# Patient Record
Sex: Female | Born: 1960 | Race: Black or African American | Hispanic: No | Smoking: Never smoker
Health system: Southern US, Community
[De-identification: ages and names within clinical notes are randomized; demographics above are authoritative.]

## PROBLEM LIST (undated history)

## (undated) DIAGNOSIS — Z9889 Other specified postprocedural states: Secondary | ICD-10-CM

## (undated) DIAGNOSIS — H8109 Meniere's disease, unspecified ear: Secondary | ICD-10-CM

## (undated) DIAGNOSIS — J45909 Unspecified asthma, uncomplicated: Secondary | ICD-10-CM

## (undated) DIAGNOSIS — G4730 Sleep apnea, unspecified: Secondary | ICD-10-CM

## (undated) DIAGNOSIS — E119 Type 2 diabetes mellitus without complications: Secondary | ICD-10-CM

## (undated) DIAGNOSIS — E785 Hyperlipidemia, unspecified: Secondary | ICD-10-CM

## (undated) DIAGNOSIS — I10 Essential (primary) hypertension: Secondary | ICD-10-CM

## (undated) DIAGNOSIS — R112 Nausea with vomiting, unspecified: Secondary | ICD-10-CM

## (undated) HISTORY — DX: Unspecified asthma, uncomplicated: J45.909

## (undated) HISTORY — DX: Sleep apnea, unspecified: G47.30

## (undated) HISTORY — DX: Other specified postprocedural states: R11.2

## (undated) HISTORY — DX: Meniere's disease, unspecified ear: H81.09

## (undated) HISTORY — DX: Type 2 diabetes mellitus without complications: E11.9

## (undated) HISTORY — DX: Hyperlipidemia, unspecified: E78.5

## (undated) HISTORY — DX: Other specified postprocedural states: Z98.890

---

## 1998-06-20 HISTORY — PX: TUBAL LIGATION: SHX77

## 2004-06-29 ENCOUNTER — Emergency Department (HOSPITAL_COMMUNITY): Admission: EM | Admit: 2004-06-29 | Discharge: 2004-06-29 | Payer: Self-pay | Admitting: Family Medicine

## 2005-12-01 ENCOUNTER — Ambulatory Visit (HOSPITAL_COMMUNITY): Admission: RE | Admit: 2005-12-01 | Discharge: 2005-12-01 | Payer: Self-pay | Admitting: Obstetrics

## 2005-12-06 ENCOUNTER — Emergency Department (HOSPITAL_COMMUNITY): Admission: EM | Admit: 2005-12-06 | Discharge: 2005-12-06 | Payer: Self-pay | Admitting: Family Medicine

## 2007-02-28 ENCOUNTER — Emergency Department (HOSPITAL_COMMUNITY): Admission: EM | Admit: 2007-02-28 | Discharge: 2007-02-28 | Payer: Self-pay | Admitting: Emergency Medicine

## 2007-06-21 HISTORY — PX: NOVASURE ABLATION: SHX5394

## 2007-10-16 ENCOUNTER — Emergency Department (HOSPITAL_COMMUNITY): Admission: EM | Admit: 2007-10-16 | Discharge: 2007-10-16 | Payer: Self-pay | Admitting: Family Medicine

## 2008-12-12 ENCOUNTER — Ambulatory Visit (HOSPITAL_COMMUNITY): Admission: RE | Admit: 2008-12-12 | Discharge: 2008-12-12 | Payer: Self-pay | Admitting: Obstetrics

## 2008-12-18 ENCOUNTER — Encounter: Admission: RE | Admit: 2008-12-18 | Discharge: 2008-12-18 | Payer: Self-pay | Admitting: Cardiology

## 2009-01-07 ENCOUNTER — Ambulatory Visit (HOSPITAL_COMMUNITY): Admission: RE | Admit: 2009-01-07 | Discharge: 2009-01-07 | Payer: Self-pay | Admitting: Obstetrics

## 2009-01-07 ENCOUNTER — Encounter (INDEPENDENT_AMBULATORY_CARE_PROVIDER_SITE_OTHER): Payer: Self-pay | Admitting: Obstetrics

## 2009-04-09 ENCOUNTER — Other Ambulatory Visit (HOSPITAL_COMMUNITY): Admission: RE | Admit: 2009-04-09 | Discharge: 2009-05-05 | Payer: Self-pay | Admitting: Psychiatry

## 2009-04-09 ENCOUNTER — Ambulatory Visit: Payer: Self-pay | Admitting: Psychiatry

## 2010-09-26 LAB — BASIC METABOLIC PANEL WITH GFR
BUN: 6 mg/dL (ref 6–23)
CO2: 29 meq/L (ref 19–32)
Calcium: 9.3 mg/dL (ref 8.4–10.5)
Chloride: 100 meq/L (ref 96–112)
Creatinine, Ser: 0.63 mg/dL (ref 0.4–1.2)
GFR calc non Af Amer: >60 mL/min
Glucose, Bld: 88 mg/dL (ref 70–99)
Potassium: 3.1 meq/L — ABNORMAL LOW (ref 3.5–5.1)
Sodium: 139 meq/L (ref 135–145)

## 2010-09-26 LAB — PREGNANCY, URINE: Preg Test, Ur: NEGATIVE

## 2010-09-26 LAB — CBC: RDW: 15.3 % (ref 11.5–15.5)

## 2010-11-02 NOTE — Op Note (Signed)
Annette Cabrera, Annette Cabrera             ACCOUNT NO.:  [id number]   MEDICAL RECORD NO.:  [id number]          PATIENT TYPE:  AMB   LOCATION:  SDC                           FACILITY:  [facility]   PHYSICIAN:  Kathreen Cosier, M.D.DATE OF BIRTH:  [birth year]   DATE OF PROCEDURE:  01/07/2009  DATE OF DISCHARGE:  01/07/2009                               OPERATIVE REPORT   PREOPERATIVE DIAGNOSIS:  Dysfunctional uterine bleeding.   PROCEDURE:  Dilation and curettage, hysteroscopy, NovaSure ablation.   POSTOPERATIVE DIAGNOSIS:  Dysfunctional uterine bleeding.   SURGEON:  Kathreen Cosier, MD   Under general anesthesia, the patient was in a lithotomy position,  perineum, and vagina prepped and draped.  Bladder emptied with the  straight catheter.  Bimanual exam revealed uterus to be top normal size,  negative adnexa.  Speculum placed in the vagina.  Cervix injected with  10 mL of 1% Xylocaine.  Endocervix was curetted, small amount of tissue  obtained.  Endometrial cavity sounded 10 cm.  The cervical length was  measured at 5 cm.  Cervix was then dilated to #25 Shawnie Pons.  Hysteroscope  inserted.  The cavity was normal.  The hysteroscope was then removed.  Sharp curettage performed.  Small amount of tissue obtained.  NovaSure  device was then inserted.  Cavity integrity test passed and the cavity  width was 4.5 cm.  The endometrial cavity was then ablated for 1 minute  and 7 seconds at 124 watts.  After ablation, the NovaSure device  removed.  The hysteroscope reinserted and the cavity was totally  ablated.  The fluid deficit was 45 mL.  The patient tolerated the  procedure well, taken to recovery room in good condition.            ______________________________  Kathreen Cosier, M.D.     BAM/MEDQ  D:  01/07/2009  T:  01/08/2009  Job:  [id number]

## 2011-02-08 ENCOUNTER — Emergency Department (HOSPITAL_COMMUNITY)
Admission: EM | Admit: 2011-02-08 | Discharge: 2011-02-08 | Disposition: A | Discharge status: Home / Self Care | Payer: 59 | Attending: Emergency Medicine | Admitting: Emergency Medicine

## 2011-02-08 ENCOUNTER — Emergency Department (HOSPITAL_COMMUNITY): Payer: 59

## 2011-02-08 DIAGNOSIS — I10 Essential (primary) hypertension: Secondary | ICD-10-CM | POA: Insufficient documentation

## 2011-02-08 DIAGNOSIS — I517 Cardiomegaly: Secondary | ICD-10-CM | POA: Insufficient documentation

## 2011-02-08 DIAGNOSIS — M546 Pain in thoracic spine: Secondary | ICD-10-CM | POA: Insufficient documentation

## 2011-02-08 DIAGNOSIS — R072 Precordial pain: Secondary | ICD-10-CM | POA: Insufficient documentation

## 2011-02-08 DIAGNOSIS — R0602 Shortness of breath: Secondary | ICD-10-CM | POA: Insufficient documentation

## 2011-02-08 LAB — POCT I-STAT TROPONIN I: Troponin i, poc: 0.02 ng/mL (ref 0.00–0.08)

## 2011-02-08 LAB — COMPREHENSIVE METABOLIC PANEL
ALT: 15 U/L (ref 0–35)
CO2: 28 mEq/L (ref 19–32)
Chloride: 101 mEq/L (ref 96–112)
Creatinine, Ser: 0.57 mg/dL (ref 0.50–1.10)
GFR calc Af Amer: >60 mL/min (ref >60)
Glucose, Bld: 105 mg/dL — ABNORMAL HIGH (ref 70–99)
Potassium: 3 mEq/L — ABNORMAL LOW (ref 3.5–5.1)
Sodium: 140 mEq/L (ref 135–145)

## 2011-02-08 LAB — CBC
HCT: 41.2 % (ref 36.0–46.0)
MCH: 30.6 pg (ref 26.0–34.0)
MCHC: 35.7 g/dL (ref 30.0–36.0)
Platelets: 317 10*3/uL (ref 150–400)
RBC: 4.8 MIL/uL (ref 3.87–5.11)

## 2011-04-01 LAB — POCT RAPID STREP A: Streptococcus, Group A Screen (Direct): POSITIVE — AB

## 2012-06-05 ENCOUNTER — Emergency Department (HOSPITAL_COMMUNITY): Payer: Self-pay

## 2012-06-05 ENCOUNTER — Encounter (HOSPITAL_COMMUNITY): Payer: Self-pay | Admitting: Emergency Medicine

## 2012-06-05 ENCOUNTER — Emergency Department (HOSPITAL_COMMUNITY)
Admission: EM | Admit: 2012-06-05 | Discharge: 2012-06-05 | Disposition: A | Discharge status: Home / Self Care | Payer: Self-pay | Attending: Emergency Medicine | Admitting: Emergency Medicine

## 2012-06-05 DIAGNOSIS — S4980XA Other specified injuries of shoulder and upper arm, unspecified arm, initial encounter: Secondary | ICD-10-CM | POA: Insufficient documentation

## 2012-06-05 DIAGNOSIS — Y929 Unspecified place or not applicable: Secondary | ICD-10-CM | POA: Insufficient documentation

## 2012-06-05 DIAGNOSIS — S46909A Unspecified injury of unspecified muscle, fascia and tendon at shoulder and upper arm level, unspecified arm, initial encounter: Secondary | ICD-10-CM | POA: Insufficient documentation

## 2012-06-05 DIAGNOSIS — Y939 Activity, unspecified: Secondary | ICD-10-CM | POA: Insufficient documentation

## 2012-06-05 DIAGNOSIS — M25519 Pain in unspecified shoulder: Secondary | ICD-10-CM

## 2012-06-05 DIAGNOSIS — I10 Essential (primary) hypertension: Secondary | ICD-10-CM | POA: Insufficient documentation

## 2012-06-05 DIAGNOSIS — W010XXA Fall on same level from slipping, tripping and stumbling without subsequent striking against object, initial encounter: Secondary | ICD-10-CM | POA: Insufficient documentation

## 2012-06-05 HISTORY — DX: Essential (primary) hypertension: I10

## 2012-06-05 MED ORDER — OXYCODONE-ACETAMINOPHEN 5-325 MG PO TABS: 1.0000 | ORAL_TABLET | Freq: Four times a day (QID) | ORAL | Status: DC | PRN

## 2012-06-05 NOTE — ED Provider Notes (Signed)
Medical screening examination/treatment/procedure(s) were performed by non-physician practitioner and as supervising physician I was immediately available for consultation/collaboration.  Gerhard Munch, MD 06/05/12 2356

## 2012-06-05 NOTE — ED Provider Notes (Signed)
History     CSN: [id number]  Arrival date & time 06/05/12  1358   First MD Initiated Contact with Patient 06/05/12 1554      Chief Complaint  Patient presents with  . Arm Pain    (Consider location/radiation/quality/duration/timing/severity/associated sxs/prior treatment) HPI Comments: 51 year old female presents to the emergency department complaining of left shoulder pain after falling on her arm 3 months ago. She was outside when her slipper got stuck and she tripped and fell onto her left shoulder. Her shoulder has been bothering on and off since. Describes the pain as throbbing, rated 8/10 worse with movement. Occasionally has difficulty getting comfortable sleeping due to pain. Naproxen provides temporary relief. Denies numbness or tingling down her arm. No previous injury to this shoulder.  Patient is a 51 y.o. female presenting with arm pain. The history is provided by the patient.  Arm Pain Pertinent negatives include no numbness.    Past Medical History  Diagnosis Date  . Hypertension     History reviewed. No pertinent past surgical history.  History reviewed. No pertinent family history.  History  Substance Use Topics  . Smoking status: Never Smoker   . Smokeless tobacco: Not on file  . Alcohol Use: No    OB History    Grav Para Term Preterm Abortions TAB SAB Ect Mult Living                  Review of Systems  Constitutional: Negative for activity change.  Musculoskeletal:       Positive for left shoulder pain.  Skin: Negative for wound.  Neurological: Negative for numbness.    Allergies  Penicillins  Home Medications   Current Outpatient Rx  Name  Route  Sig  Dispense  Refill  . NAPROXEN SODIUM 220 MG PO TABS   Oral   Take 220 mg by mouth 2 (two) times daily as needed. For pain.         . OXYCODONE-ACETAMINOPHEN 5-325 MG PO TABS   Oral   Take 1-2 tablets by mouth every 6 (six) hours as needed for pain.   10 tablet   0     BP  174/100  Pulse 91  Temp 98.4 F (36.9 C) (Oral)  Resp 16  SpO2 98%  Physical Exam  Constitutional: She is oriented to person, place, and time. She appears well-developed. No distress.       Obese  HENT:  Head: Normocephalic and atraumatic.  Mouth/Throat: Oropharynx is clear and moist.  Eyes: Conjunctivae normal and EOM are normal. Pupils are equal, round, and reactive to light.  Neck: Normal range of motion. Neck supple. Muscular tenderness (left trapezius) present. No spinous process tenderness present.  Cardiovascular: Normal rate, regular rhythm, normal heart sounds and intact distal pulses.   Pulmonary/Chest: Effort normal and breath sounds normal.  Musculoskeletal:       Left shoulder: She exhibits decreased range of motion (with abduction to 90 degrees with passive ROM. decreased active ROM in all directions), tenderness (entire shoulder girdle), bony tenderness and decreased strength. She exhibits no swelling, no deformity and normal pulse.       Positive impingement sign.  Neurological: She is alert and oriented to person, place, and time. No sensory deficit.  Skin: Skin is warm and dry.  Psychiatric: She has a normal mood and affect. Her behavior is normal.    ED Course  Procedures (including critical care time)  Labs Reviewed - No data to display Dg Shoulder Left  06/05/2012  *RADIOLOGY REPORT*  Clinical Data: Shoulder pain post fall few days ago  LEFT SHOULDER - 2+ VIEW  Comparison: None.  Findings: Four views of the left shoulder submitted.  No acute fracture or subluxation.  Mild degenerative changes left acromioclavicular joint.  Mild inferior spurring of distal clavicle.  IMPRESSION: No acute fracture or subluxation.  Mild degenerative changes left AC joint.   Original Report Authenticated By: Natasha Mead, M.D.      1. Shoulder pain       MDM  51 year old female with left shoulder pain after falling 3 months ago. I will provider with a sling along with pain  medication for her nighttime when she cannot get comfortable. I advised her to followup with one of the resources to develop care with a primary care physician to discuss a possible MRI of her shoulder. There is concern for rotator cuff injury. Advised her to rest and avoid heavy lifting. Also advised ice.        Trevor Mace, PA-C 06/05/12 1627

## 2012-06-05 NOTE — ED Notes (Signed)
Pt states that she began having lt arm/shoulder pain when she fell on her arm about a month ago.  Has been hurting on and off ever since.  Pain with movement 8/10.

## 2012-06-05 NOTE — ED Notes (Signed)
Benedict, Ortho Tech, called regarding sling immobilizer, will monitor.

## 2013-04-24 ENCOUNTER — Ambulatory Visit (INDEPENDENT_AMBULATORY_CARE_PROVIDER_SITE_OTHER): Payer: Managed Care, Other (non HMO) | Admitting: Family Medicine

## 2013-04-24 ENCOUNTER — Encounter: Payer: Self-pay | Admitting: Family Medicine

## 2013-04-24 VITALS — BP 173/116 | HR 99 | Temp 98.2°F | Wt 258.0 lb

## 2013-04-24 DIAGNOSIS — Z7689 Persons encountering health services in other specified circumstances: Secondary | ICD-10-CM

## 2013-04-24 DIAGNOSIS — Z7189 Other specified counseling: Secondary | ICD-10-CM | POA: Diagnosis not present

## 2013-04-24 DIAGNOSIS — Z23 Encounter for immunization: Secondary | ICD-10-CM

## 2013-04-24 DIAGNOSIS — I10 Essential (primary) hypertension: Secondary | ICD-10-CM | POA: Diagnosis not present

## 2013-04-24 LAB — BASIC METABOLIC PANEL
Calcium: 9.7 mg/dL (ref 8.4–10.5)
Glucose, Bld: 85 mg/dL (ref 70–99)
Potassium: 3.6 mEq/L (ref 3.5–5.3)

## 2013-04-24 MED ORDER — AMLODIPINE BESYLATE 5 MG PO TABS: 5.0000 mg | ORAL_TABLET | Freq: Every day | ORAL | Status: DC

## 2013-04-24 MED ORDER — HYDROCHLOROTHIAZIDE 25 MG PO TABS: 25.0000 mg | ORAL_TABLET | Freq: Every day | ORAL | Status: DC

## 2013-04-24 NOTE — Patient Instructions (Signed)
It was nice to meet you today!  I sent in two blood pressure medicines for you: -amlodipine 5mg  daily -HCTZ 25mg  daily  I will call you if your test results are not normal.  Otherwise, I will send you a letter.  If you do not hear from me with in 2 weeks please call our office.      Return for a visit in 2-3 weeks to recheck your blood pressure, at that point we will repeat your bloodwork to be sure your kidneys are okay with the new medicine.  Be well, Dr. Pollie Meyer

## 2013-04-24 NOTE — Assessment & Plan Note (Addendum)
Blood pressure elevated, even on recheck. Given that she is African American and her blood pressure is as high as it is, we'll go ahead and start 2 medications today. Will start with amlodipine 5 mg daily and HCTZ 25 mg daily. Will check BMET today to establish baseline renal function, and have patient return in 2-3 weeks for a visit to recheck her blood pressure. At that time we'll repeat her BMET. Patient is agreeable to this plan.

## 2013-04-24 NOTE — Progress Notes (Signed)
Patient ID: Annette Cabrera, female   DOB: 1961-06-10, 52 y.o.   MRN: [id number]   HPI:  Patient presents today for a new patient appointment to establish general primary care, also to discuss hypertension.  Hypertension: Patient reports that she is to take 5 different blood pressure medicines. She most recently was on Exforge, but had to stop taking it due to cost. Was previously getting care for her hypertension at an urgent care office. She has not taken this in about 2 months. She denies any history of kidney problems or heart problems. She had a stress test at [facility] about 2 years ago, which was normal per patient. She does not check her blood pressure at home. She did have it checked yesterday and says it was 140/90. She denies any chest pain or shortness of breath. Occasionally has headaches with sinus congestion, denies any vision changes. She does endorse occasional swelling of her legs. She had blood work earlier this year at an urgent care facility. She does not have a copy of those results with her today.  ROS: See HPI. Complete ROS sheet also positive for trouble seeing (wears glasses), swelling of feet, and muscle cramps or aches.  Past Medical Hx:  Hypertension Mnire's disease  Past Surgical Hx: NovaSure ablation, tubal ligation  OB/GYN history: [id number], had one elective abortion, all other pregnancies were term.  Family Hx: updated in Epic. Strong family history of diabetes. Father died of lung cancer. Sister was just diagnosed with some kind of pelvic bone cancer. No family history of colon or breast cancer.  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 She states she walks a lot but "not enough". She's never smoked cigarettes. She does not drink alcohol or use illicit drugs. She feels safe in her relationships, but states she has not any romantic relationship. She denies any symptoms of depression.  Medications: Does not currently take any medications regularly  Allergies: Has a  history of penicillin allergy, states she passed out when she took it  PHYSICAL EXAM: BP 173/116  Pulse 99  Temp(Src) 98.2 F (36.8 C) (Oral)  Wt 258 lb (117.028 kg) Gen: No acute distress, pleasant and cooperative Heart: Regular rate and rhythm, and no murmurs appreciated Lungs: Clear to auscultation bilaterally, normal respiratory effort Abdomen: Soft, nontender to palpation, no appreciable masses or organomegaly Neuro: Grossly nonfocal, speech intact, pupils equal round reactive to light Extremities: No appreciable lower extremity edema bilaterally  ASSESSMENT/PLAN:  # Health maintenance:  -Behind on multiple health maintenance items including Pap, mammogram, and colonoscopy. -Advised patient that we will want to schedule her for physical to get her updated on these multiple health maintenance items. -flu shot given today  # See also problem based charting.  FOLLOW UP: F/u in 2-3 weeks for repeat blood pressure and BMET.

## 2013-04-26 ENCOUNTER — Encounter: Payer: Self-pay | Admitting: Family Medicine

## 2013-05-08 ENCOUNTER — Ambulatory Visit (INDEPENDENT_AMBULATORY_CARE_PROVIDER_SITE_OTHER): Payer: Medicare HMO | Admitting: *Deleted

## 2013-05-08 VITALS — BP 146/94

## 2013-05-08 DIAGNOSIS — I10 Essential (primary) hypertension: Secondary | ICD-10-CM

## 2013-05-08 LAB — BASIC METABOLIC PANEL
BUN: 12 mg/dL (ref 6–23)
CO2: 29 mEq/L (ref 19–32)
Calcium: 9.6 mg/dL (ref 8.4–10.5)
Creat: 0.72 mg/dL (ref 0.50–1.10)
Glucose, Bld: 121 mg/dL — ABNORMAL HIGH (ref 70–99)
Sodium: 141 mEq/L (ref 135–145)

## 2013-05-08 NOTE — Progress Notes (Signed)
Patient in today for blood pressure check. Blood pressure taken manually in left arm with large cuff was 146/94. Patient reports regular taking of blood pressure medications since last office visit. Last note stated that MD wanted repeat BMET and for patient to follow up in two weeks, put in order for BMET and scheduled next available appointment with PCP for patient to follow up. Will forward note to PCP.

## 2013-05-11 ENCOUNTER — Telehealth: Payer: Self-pay | Admitting: Family Medicine

## 2013-05-11 NOTE — Telephone Encounter (Signed)
Late Entry note:  Attempted to contact patient last night on 11/21 at around 8pm, to discuss hypokalemia on lab results. The only phone number we have on file for pt is her work phone, and she did not answer this phone. I am planning to send in an rx for potassium chloride PO qd but want to talk to her about this first. Will attempt to contact again on Monday. Will also plan to increase her amlodipine to 10mg  daily since she was still hypertensive at RN visit. She will need an office visit in 3-4 weeks to reassess BP control.  Latrelle Dodrill, MD

## 2013-05-14 MED ORDER — POTASSIUM CHLORIDE ER 20 MEQ PO TBCR: 40.0000 meq | EXTENDED_RELEASE_TABLET | Freq: Every day | ORAL | Status: DC

## 2013-05-14 MED ORDER — AMLODIPINE BESYLATE 10 MG PO TABS: 10.0000 mg | ORAL_TABLET | Freq: Every day | ORAL | Status: DC

## 2013-05-14 NOTE — Telephone Encounter (Signed)
Attempted to reach patient again but no answer. Left voicemail asking patient to return the call. When patient returns the call, please inform her of the following:  - Her potassium is low (likely from the hydrochlorothiazide) so I am sending in a prescription for potassium pills for her to take each day. This is important to keep the potassium from being too low. She should still continue to taking the HCTZ. - I sent in an increased dose of the amlodipine since her BP was still elevated at her nurse visit last week. - She should follow up with me in 3-4 weeks in the office.   I am happy to return the call again if she has any questions. Latrelle Dodrill, MD

## 2013-05-20 NOTE — Telephone Encounter (Signed)
Red team, can you attempt to call this patient again today? Thanks! Annette Dodrill, MD

## 2013-05-20 NOTE — Telephone Encounter (Signed)
Called pt. LMVM to call back. Please see Dr.McIntyre's message. .Shanese Riemenschneider  

## 2013-05-21 NOTE — Telephone Encounter (Signed)
Spoke with patient and informed he of below 

## 2013-05-29 ENCOUNTER — Ambulatory Visit (INDEPENDENT_AMBULATORY_CARE_PROVIDER_SITE_OTHER): Payer: Medicare HMO | Admitting: Family Medicine

## 2013-05-29 ENCOUNTER — Encounter: Payer: Self-pay | Admitting: Family Medicine

## 2013-05-29 ENCOUNTER — Ambulatory Visit: Payer: Medicare HMO | Admitting: Family Medicine

## 2013-05-29 VITALS — BP 163/88 | HR 106 | Temp 97.6°F | Ht 62.0 in | Wt 254.0 lb

## 2013-05-29 DIAGNOSIS — J309 Allergic rhinitis, unspecified: Secondary | ICD-10-CM

## 2013-05-29 DIAGNOSIS — I10 Essential (primary) hypertension: Secondary | ICD-10-CM

## 2013-05-29 LAB — BASIC METABOLIC PANEL
BUN: 10 mg/dL (ref 6–23)
CO2: 30 mEq/L (ref 19–32)
Chloride: 99 mEq/L (ref 96–112)

## 2013-05-29 MED ORDER — LISINOPRIL 20 MG PO TABS: 20.0000 mg | ORAL_TABLET | Freq: Every day | ORAL | Status: DC

## 2013-05-29 NOTE — Progress Notes (Signed)
Patient ID: Annette Cabrera, female   DOB: 1960-11-23, 52 y.o.   MRN: [id number]  HPI:  Allergies: Pt states she takes allegra and sometimes nasocort. She used to use off brand medicine but stopped responding to it. She is currently only using allegra s needed. She started to notice worsening sinus problems during thanksgiving after smelling strong paint smell at her office. She denies any fevers. She blows clear mucous out of her nose.   HTN: Currently taking amlodipine 10mg  daily and HCTZ 25mg  daily. She does not check her blood pressure at home. States she feels well. The swelling she noticed in her legs previously has improved. Denies any chest pain, SOB, vision changes. Has only had a sinus headache as described above.   ROS: See HPI  PMFSH: has had previous tubal ligation and novasure ablation  PHYSICAL EXAM: BP 163/88  Pulse 106  Temp(Src) 97.6 F (36.4 C) (Oral)  Ht 5\' 2"  (1.575 m)  Wt 254 lb (115.214 kg)  BMI 46.45 kg/m2 Gen: NAD, pleasant and cooperative HEENT: NCAT, PERRL, TM's clear bilat, MMM, no oral exudates, mild facial tenderness throughout but no focal areas of tenderness or swellign Heart: RRR Lungs: CTAB, NWOB Neuro: grossly nonfocal, speech intact Ext: No appreciable lower extremity edema bilaterally   ASSESSMENT/PLAN:  See problem based charting for assessment/plan.   FOLLOW UP: F/u in 1 week for RN BP check and repeat BMET after starting lisinopril. I will contact pt for how soon she needs to f/u after that, depending on what her BP and BMET show.  [place] J. Pollie Meyer, MD [facility]

## 2013-05-29 NOTE — Patient Instructions (Signed)
It was great to see you again today!  For your sinuses - try switching to zyrtec 10mg  daily. Also increase the use of the neti pot. If you aren't getting any results, or have fevers, worsening pain, etc, please return for another visit.  For BP - I sent in a new medicine, lisinopril. We will need to recheck your kidney function in 1 week. Please schedule a lab appointment and nurse visit to recheck the BP in one week. We are also checking bloodwork today. I will call you if your test results are not normal.  Otherwise, I will send you a letter.  If you do not hear from me with in 2 weeks please call our office.      Be well, Dr. Pollie Meyer

## 2013-05-30 DIAGNOSIS — J309 Allergic rhinitis, unspecified: Secondary | ICD-10-CM | POA: Insufficient documentation

## 2013-05-30 NOTE — Assessment & Plan Note (Signed)
Recommend neti pot and switch to zyrtec, monitor for response. May need to add scheduled daily flonase and monitor for response.

## 2013-05-30 NOTE — Assessment & Plan Note (Signed)
BP remains elevated. Will check BMET today to ensure stability of K and Cr after starting potassium supplementation. Add lisinopril 20mg  daily. F/u in 1 week for repeat BMET and RN BP check.

## 2013-06-05 ENCOUNTER — Ambulatory Visit (INDEPENDENT_AMBULATORY_CARE_PROVIDER_SITE_OTHER): Payer: Medicare HMO | Admitting: *Deleted

## 2013-06-05 ENCOUNTER — Other Ambulatory Visit: Payer: Medicare HMO

## 2013-06-05 ENCOUNTER — Encounter: Payer: Self-pay | Admitting: Family Medicine

## 2013-06-05 ENCOUNTER — Ambulatory Visit: Payer: Medicare HMO | Admitting: Family Medicine

## 2013-06-05 VITALS — BP 132/85 | HR 91

## 2013-06-05 DIAGNOSIS — I10 Essential (primary) hypertension: Secondary | ICD-10-CM

## 2013-06-05 LAB — BASIC METABOLIC PANEL
CO2: 26 mEq/L (ref 19–32)
Calcium: 9.9 mg/dL (ref 8.4–10.5)
Creat: 0.76 mg/dL (ref 0.50–1.10)
Sodium: 139 mEq/L (ref 135–145)

## 2013-06-05 NOTE — Progress Notes (Signed)
BMP DONE TODAY Annette Cabrera 

## 2013-06-05 NOTE — Progress Notes (Signed)
Patient in today for blood pressure check. Blood pressure taken in left arm with large cuff was 132/85, pulse 91, patient reports regular taking of blood pressure medications. Will forward note to PCP.

## 2013-06-07 ENCOUNTER — Encounter: Payer: Self-pay | Admitting: Family Medicine

## 2013-06-07 NOTE — Progress Notes (Signed)
Patient ID: Annette Cabrera, female   DOB: 1961-02-26, 52 y.o.   MRN: [id number]  BP now at goal. BMET stable from lab appointment that same day. Will send pt letter asking her to f/u in 3 months with me in the office.  Latrelle Dodrill, MD

## 2013-06-10 ENCOUNTER — Telehealth: Payer: Self-pay | Admitting: Family Medicine

## 2013-06-10 NOTE — Telephone Encounter (Signed)
Called pt and informed. .Annette Cabrera  

## 2013-06-10 NOTE — Telephone Encounter (Signed)
Yes, patient should schedule office visit to address this problem. In the meantime she can try some ibuprofen over the counter to see if this helps. Please inform pt.  Latrelle Dodrill, MD

## 2013-06-10 NOTE — Telephone Encounter (Signed)
Will fwd. To PCP. Pt may need OV? Lorenda Hatchet, Renato Battles

## 2013-06-10 NOTE — Telephone Encounter (Signed)
Pt called to get advise on what to do about her back pain. When laying down it hurts, when she stands it goes away. She is not getting any sleep. jw

## 2013-06-11 ENCOUNTER — Ambulatory Visit (INDEPENDENT_AMBULATORY_CARE_PROVIDER_SITE_OTHER): Payer: Medicare HMO | Admitting: Family Medicine

## 2013-06-11 VITALS — BP 144/96 | HR 89 | Temp 98.2°F | Resp 16 | Wt 259.0 lb

## 2013-06-11 DIAGNOSIS — M549 Dorsalgia, unspecified: Secondary | ICD-10-CM

## 2013-06-11 LAB — POCT UA - MICROSCOPIC ONLY

## 2013-06-11 LAB — POCT URINALYSIS DIPSTICK
Bilirubin, UA: NEGATIVE
Blood, UA: NEGATIVE
Glucose, UA: NEGATIVE
Leukocytes, UA: NEGATIVE
Nitrite, UA: NEGATIVE
Spec Grav, UA: 1.015
Urobilinogen, UA: 0.2

## 2013-06-11 MED ORDER — CYCLOBENZAPRINE HCL 5 MG PO TABS: 2.5000 mg | ORAL_TABLET | Freq: Three times a day (TID) | ORAL | Status: DC | PRN

## 2013-06-11 MED ORDER — NAPROXEN 500 MG PO TABS: 500.0000 mg | ORAL_TABLET | Freq: Two times a day (BID) | ORAL | Status: DC

## 2013-06-11 NOTE — Patient Instructions (Signed)
It was great to meet you!  I think you may have had a virus for the last couple of days and some back spasms.   Take the naprosyn twice daily for the next 5 days, then as needed Try ice 20 minutes every 2 hours as needed.

## 2013-06-11 NOTE — Progress Notes (Signed)
Patient ID: Annette Cabrera, female   DOB: 05-27-1961, 52 y.o.   MRN: [id number]   Kevin Fenton, MD Phone: [phone]  Subjective:  Chief complaint-noted  # SDA for low back pain  52 year old female with hypertension here with 4 days of 9/10 intermittent dull low back pain is radiating down her right leg. She notes that it's been fine during the day when she is up walking around but worse at night when she lays down. She states that she's been losing sleep over. She also notes that improved over the last one day. She also notes fatigue, mild chills, constipation, and increased urinary frequency.  ROS-  positive chills No fever, sweats No dyspnea No chest pain No diarrhea, positive constipation No lower extremity weakness or numbness, bowel or bladder dysfunction, or saddle anesthesia.   Past Medical History Patient Active Problem List   Diagnosis Date Noted  . Back pain 06/11/2013  . Allergic rhinitis 05/30/2013  . Hypertension 04/24/2013    Medications- reviewed and updated Current Outpatient Prescriptions  Medication Sig Dispense Refill  . amLODipine (NORVASC) 10 MG tablet Take 1 tablet (10 mg total) by mouth daily.  30 tablet  1  . cyclobenzaprine (FLEXERIL) 5 MG tablet Take 0.5-1 tablets (2.5-5 mg total) by mouth 3 (three) times daily as needed for muscle spasms.  30 tablet  0  . hydrochlorothiazide (HYDRODIURIL) 25 MG tablet Take 1 tablet (25 mg total) by mouth daily.  30 tablet  1  . lisinopril (PRINIVIL,ZESTRIL) 20 MG tablet Take 1 tablet (20 mg total) by mouth daily.  30 tablet  1  . naproxen (NAPROSYN) 500 MG tablet Take 1 tablet (500 mg total) by mouth 2 (two) times daily with a meal. Take twice daily for 5 days, then as needed.  30 tablet  0  . potassium chloride 20 MEQ TBCR Take 40 mEq by mouth daily.  60 tablet  1   No current facility-administered medications for this visit.    Objective: BP 144/96  Pulse 89  Temp(Src) 98.2 F (36.8 C) (Oral)  Resp  16  Wt 259 lb (117.482 kg)  SpO2 97% Gen: NAD, alert, cooperative with exam HEENT: NCAT, EOMI, PERRL CV: RRR, good S1/S2, no murmur Resp: CTABL, no wheezes, non-labored Abd: SNTND, BS present, no guarding or organomegaly, no CVA tenderness Ext: Trace to 1+ pitting edema Neuro: Alert and oriented, strength 5/5 and sensation intact in all 4 extremities, normal gait. MSK: Mild tenderness of paraspinal muscles on R side in lumbar area, no bony tenderness,   Assessment/Plan:  Back pain On exam this appears to be musculoskeletal pain UA negative, micro with lots of squams,  No red flags, reviewed in detail Scheduled naprosyn for 5 days, Ice, and flexeril PRN Follow up with PCP as needed.     Orders Placed This Encounter  Procedures  . POCT urinalysis dipstick  . POCT UA - Microscopic Only    Meds ordered this encounter  Medications  . naproxen (NAPROSYN) 500 MG tablet    Sig: Take 1 tablet (500 mg total) by mouth 2 (two) times daily with a meal. Take twice daily for 5 days, then as needed.    Dispense:  30 tablet    Refill:  0  . cyclobenzaprine (FLEXERIL) 5 MG tablet    Sig: Take 0.5-1 tablets (2.5-5 mg total) by mouth 3 (three) times daily as needed for muscle spasms.    Dispense:  30 tablet    Refill:  0

## 2013-06-11 NOTE — Assessment & Plan Note (Signed)
On exam this appears to be musculoskeletal pain UA negative, micro with lots of squams,  No red flags, reviewed in detail Scheduled naprosyn for 5 days, Ice, and flexeril PRN Follow up with PCP as needed.

## 2013-06-28 ENCOUNTER — Telehealth: Payer: Self-pay | Admitting: *Deleted

## 2013-06-28 MED ORDER — HYDROCHLOROTHIAZIDE 25 MG PO TABS: 25.0000 mg | ORAL_TABLET | Freq: Every day | ORAL | Status: DC

## 2013-06-28 NOTE — Telephone Encounter (Signed)
Pt needs a refill on her HCTZ. Jazmin Hartsell,CMA

## 2013-06-28 NOTE — Telephone Encounter (Signed)
Rx sent in Brilynn Biasi J Jameica Couts, MD  

## 2013-07-09 ENCOUNTER — Other Ambulatory Visit: Payer: Self-pay | Admitting: Family Medicine

## 2013-07-09 MED ORDER — AMLODIPINE BESYLATE 10 MG PO TABS: 10.0000 mg | ORAL_TABLET | Freq: Every day | ORAL | Status: DC

## 2013-07-24 ENCOUNTER — Other Ambulatory Visit: Payer: Self-pay | Admitting: Family Medicine

## 2013-09-16 ENCOUNTER — Encounter: Payer: Self-pay | Admitting: Family Medicine

## 2013-09-16 ENCOUNTER — Ambulatory Visit (INDEPENDENT_AMBULATORY_CARE_PROVIDER_SITE_OTHER): Payer: Medicare HMO | Admitting: Family Medicine

## 2013-09-16 VITALS — BP 140/80 | HR 94 | Temp 98.9°F | Wt 257.0 lb

## 2013-09-16 DIAGNOSIS — H8109 Meniere's disease, unspecified ear: Secondary | ICD-10-CM

## 2013-09-16 DIAGNOSIS — I10 Essential (primary) hypertension: Secondary | ICD-10-CM

## 2013-09-16 NOTE — Progress Notes (Signed)
Patient ID: Annette Cabrera, female   DOB: 1961/02/03, 53 y.o.   MRN: [id number]  HPI:  HTN: Currently taking amlodipine 10mg  daily, HCTZ 25mg  daily, and lisinopril 20mg  daily. Feels well. Denies CP, SOB, lower extremity edema. Does not check her BP at home.  History of Mnire's disease: Patient reports that she was diagnosed with Mnire's disease 15 years ago by me and to Dr. in [place]. She has had intermittent flareups since that time. She does not take any medicine for this chronically. About 2 weeks ago she had a flare where she felt dizzy, had fullness in her ear, and felt confused. This is typical for her previous flares. It lasted about 3 days, and spontaneously improved. It had previously been about 2-3 months since she had a flare.  ROS: See HPI  [place]: HTN, hx menieres dz, obesity  PHYSICAL EXAM: BP 140/80  Pulse 94  Temp(Src) 98.9 F (37.2 C) (Oral)  Wt 257 lb (116.574 kg) Gen: NAD, pleasant, cooperative HEENT: NCAT, MMM, TM's clear bilaterally Heart: RRR, no murmurs Lungs: CTAB, NWOB Neuro: grossly nonfocal, speech intact, EOMI normal without nystagmus, PERRL, face symmetric, tongue protrudes midline, strength 5/5 in all extremities and w/ shoulder shrug  ASSESSMENT/PLAN:  # Health maintenance:  -reminded pt to schedule separate visit for health maintenance items  See problem based charting for additional assessment/plan.  FOLLOW UP: F/u in 2 months for HTN Referring to ENT for Menieres disease  [place] J. Ardelia Mems, [place]

## 2013-09-16 NOTE — Assessment & Plan Note (Signed)
Pt reports sx's of recent Menieres disease flare, now improved. As she is not established with ENT in town (previously saw one in [facility]), will refer pt to ENT for management of this condition. Precepted with Dr. Mingo Amber who agrees with this plan.

## 2013-09-16 NOTE — Assessment & Plan Note (Signed)
Well controlled. Since systolic BP is borderline (140) will have her return slightly sooner than normal (in 2 months) for recheck. Continue current medications.

## 2013-09-16 NOTE — Patient Instructions (Signed)
It was great to see you again today!  Follow up for your BP in 2 months. Continue current medicines.  I am referring you to ENT for your Menieres disease. You will get a phone call to schedule this appointment.   Schedule a separate health maintenance visit (routine yearly physical). At this visit we will make sure you are up to date on all your important health maintenance items.  Be well, Dr. Ardelia Mems

## 2013-09-25 ENCOUNTER — Telehealth: Payer: Self-pay | Admitting: Family Medicine

## 2013-09-25 NOTE — Telephone Encounter (Signed)
Called pt x 3 to let her know about ENT's appt but VM unable to take msg.  [facility]  Appt 10/03/2013 @2 :30pm  [address] [place] [address]  [phone]

## 2013-09-26 NOTE — Telephone Encounter (Signed)
Called pt again. Informed pt of appt. Pt repeated it back to me. Annette Cabrera

## 2013-11-15 ENCOUNTER — Ambulatory Visit: Payer: Medicare HMO | Admitting: Family Medicine

## 2014-01-02 ENCOUNTER — Encounter: Payer: Self-pay | Admitting: Family Medicine

## 2014-01-02 ENCOUNTER — Ambulatory Visit (INDEPENDENT_AMBULATORY_CARE_PROVIDER_SITE_OTHER): Payer: Medicare HMO | Admitting: Family Medicine

## 2014-01-02 VITALS — BP 141/93 | HR 98 | Temp 97.9°F | Wt 257.0 lb

## 2014-01-02 DIAGNOSIS — J0140 Acute pansinusitis, unspecified: Secondary | ICD-10-CM

## 2014-01-02 DIAGNOSIS — J018 Other acute sinusitis: Secondary | ICD-10-CM

## 2014-01-02 MED ORDER — AZITHROMYCIN 250 MG PO TABS: ORAL_TABLET | ORAL | Status: DC

## 2014-01-02 MED ORDER — GUAIFENESIN-CODEINE 100-10 MG/5ML PO SYRP: 5.0000 mL | ORAL_SOLUTION | Freq: Three times a day (TID) | ORAL | Status: DC | PRN

## 2014-01-02 NOTE — Patient Instructions (Signed)
Thank you for coming in, today!  I'm sorry you're not feeling well! I think you have a sinus infection that probably started out as a virus. It might have a bacterial component now that it's getting worse and staying around so long.  You should take an antibiotic called azithromycin, a "Z-pak." Take two 250 mg tablets today. Then take one 250 mg tablet per day for 4 more days, starting tomorrow.  You can continue to take the over-the-counter allergy medicine and HALLS as needed. I prescribed a medicine called Cheratussin, as well. This is guaifenesin (Mucinex) plus codeine, to help with the congestion and the cough. It may make you very sleepy, so take it at night. If you take it during the day, don't drive within 4-6 hours.  Come back to see Dr. Ardelia Mems as you need. If you have worse symptoms, high fever, vomiting, trouble breathing, or other new symptoms, call or come back sooner rather than later. Please feel free to call with any questions or concerns at any time, at [phone]. --Dr. Venetia Maxon

## 2014-01-02 NOTE — Progress Notes (Signed)
   Subjective:    Patient ID: Annette Cabrera, female    DOB: [birth year], 53 y.o.   MRN: [id number]  HPI: Pt presents to clinic for SDA for severe sinus congestion and cough. Her symptoms have been present for about 3 weeks, worse for the past week or more. She has not checked a temperature but did have chills this morning; she has not taken any antipyretics. Chills were new today with facial / sinus pain, which prompted her visit today. She reports nausea and dry heaves this morning. She has been taking Halls drops without much help as OTC allergy medication; this has helped the nasal congestion only slightly but not sinus drainage. She has not had significant nasal discharge, as everything is just "going back" into her throat. She has not had true significant shortness of breath.  Of note, pt has a history of Meniere's disease and her current congestion / drainage / sinus-inflammation symptoms are making her chronic dizziness worse. Her sinus symptoms are also worse with changes in position, especially if she lowers her head.  Review of Systems: As above.     Objective:   Physical Exam BP 141/93  Pulse 98  Temp(Src) 97.9 F (36.6 C) (Oral)  Wt 257 lb (116.574 kg)  SpO2 95% Gen: uncomfortable-appearing adult female in NAD but coughing frequently during interview; very hoarse HEENT: [place]/AT, EOMI, PERRLA, TM's clear bilaterally, MMM  Nasal mucosae moderately erythematous, edematous, but no frank discharge  Posterior oropharyx inflammed, with slight cobblestoning but no tonsillar exudate  Mild cervical soft tissue fullness and tenderness without large lymphadenopathy other than a few small, shotty nodes  Significant sinus tenderness to palpation over the maxilla, left > right Cardio: RRR, no murmur appreciated Pulm: CTAB, no wheezes, normal WOB Abd: soft, nontender, BS+; obese Ext: warm, well-perfused, no significant limb edema Skin: warm, dry, intact, without rashes     Assessment &  Plan:  52yo with apparent sinusitis, likely viral +/- bacterial superinfection, worse for 7-10 days - lungs clear and no lower airway increased sounds, so strongly doubt pneumonia or bronchitis component - no fever but chills and productive cough / significant drainage with worsening symptoms, so would favor abx treatment  Plan: - Rx for azithromycin, 500 mg day one then 250 mg days two through four - Rx for Cheratussin (guaifenesin-codeine) for mucolytic and cough suppressant effect; advised on drowsiness as a side effect - continue OTC allergy medication, Halls as needed, and supportive care otherwise (suggested Tylenol for pain) - could consider meclazine or other similar medication for dizziness, if it does not improve with treating sinusitis - reviewed red flags that would prompt immediate return to care, including but not limited to worse pain, fever, vomiting, SOB, etc - f/u with Dr. Ardelia Mems as needed  Emmaline Kluver, MD PGY-3, [facility] 01/02/2014, 11:55 AM

## 2014-01-16 ENCOUNTER — Other Ambulatory Visit: Payer: Self-pay | Admitting: Family Medicine

## 2014-01-28 ENCOUNTER — Other Ambulatory Visit: Payer: Self-pay | Admitting: Family Medicine

## 2014-02-05 ENCOUNTER — Ambulatory Visit: Payer: Medicare HMO | Admitting: Family Medicine

## 2014-07-27 ENCOUNTER — Other Ambulatory Visit: Payer: Self-pay | Admitting: Family Medicine

## 2014-09-25 ENCOUNTER — Other Ambulatory Visit: Payer: Self-pay | Admitting: Family Medicine

## 2014-09-25 ENCOUNTER — Telehealth: Payer: Self-pay | Admitting: Family Medicine

## 2014-09-25 ENCOUNTER — Encounter: Payer: Self-pay | Admitting: Family Medicine

## 2014-09-25 NOTE — Telephone Encounter (Signed)
Pt doesn't have a number listed in her chart. Please advise. Deseree Kennon Holter, CMA

## 2014-09-25 NOTE — Telephone Encounter (Signed)
Okay, I will send her a letter. Thanks. Leeanne Rio, MD

## 2014-09-25 NOTE — Telephone Encounter (Signed)
To [facility] red team - please call pt and let her know I have refilled her BP meds but she needs to schedule an appointment to follow up on her BP. Thanks!  Leeanne Rio, MD

## 2014-11-06 ENCOUNTER — Ambulatory Visit (INDEPENDENT_AMBULATORY_CARE_PROVIDER_SITE_OTHER): Payer: Managed Care, Other (non HMO) | Admitting: Family Medicine

## 2014-11-06 ENCOUNTER — Other Ambulatory Visit (HOSPITAL_COMMUNITY)
Admission: RE | Admit: 2014-11-06 | Discharge: 2014-11-06 | Disposition: A | Discharge status: Home / Self Care | Payer: Managed Care, Other (non HMO) | Source: Ambulatory Visit | Attending: Family Medicine | Admitting: Family Medicine

## 2014-11-06 ENCOUNTER — Encounter: Payer: Self-pay | Admitting: Family Medicine

## 2014-11-06 VITALS — BP 179/104 | HR 93 | Temp 98.0°F | Ht 62.0 in | Wt 266.6 lb

## 2014-11-06 DIAGNOSIS — I10 Essential (primary) hypertension: Secondary | ICD-10-CM | POA: Diagnosis not present

## 2014-11-06 DIAGNOSIS — Z0000 Encounter for general adult medical examination without abnormal findings: Secondary | ICD-10-CM

## 2014-11-06 DIAGNOSIS — Z23 Encounter for immunization: Secondary | ICD-10-CM

## 2014-11-06 DIAGNOSIS — Z01419 Encounter for gynecological examination (general) (routine) without abnormal findings: Secondary | ICD-10-CM | POA: Insufficient documentation

## 2014-11-06 DIAGNOSIS — Z1151 Encounter for screening for human papillomavirus (HPV): Secondary | ICD-10-CM | POA: Diagnosis present

## 2014-11-06 DIAGNOSIS — Z124 Encounter for screening for malignant neoplasm of cervix: Secondary | ICD-10-CM

## 2014-11-06 DIAGNOSIS — M7989 Other specified soft tissue disorders: Secondary | ICD-10-CM | POA: Diagnosis not present

## 2014-11-06 LAB — COMPREHENSIVE METABOLIC PANEL
ALK PHOS: 79 U/L (ref 39–117)
ALT: 20 U/L (ref 0–35)
AST: 27 U/L (ref 0–37)
Albumin: 4.1 g/dL (ref 3.5–5.2)
BUN: 6 mg/dL (ref 6–23)
CO2: 27 mEq/L (ref 19–32)
CREATININE: 0.58 mg/dL (ref 0.50–1.10)
Calcium: 9.1 mg/dL (ref 8.4–10.5)
Chloride: 104 mEq/L (ref 96–112)
Glucose, Bld: 107 mg/dL — ABNORMAL HIGH (ref 70–99)
Potassium: 3.9 mEq/L (ref 3.5–5.3)
Sodium: 141 mEq/L (ref 135–145)
Total Bilirubin: 0.8 mg/dL (ref 0.2–1.2)
Total Protein: 7.3 g/dL (ref 6.0–8.3)

## 2014-11-06 LAB — LIPID PANEL
Cholesterol: 179 mg/dL (ref 0–200)
HDL: 50 mg/dL (ref >=46)
LDL CALC: 103 mg/dL — AB (ref 0–99)
Total CHOL/HDL Ratio: 3.6 Ratio
Triglycerides: 129 mg/dL (ref <150)
VLDL: 26 mg/dL (ref 0–40)

## 2014-11-06 MED ORDER — HYDROCHLOROTHIAZIDE 25 MG PO TABS: 25.0000 mg | ORAL_TABLET | Freq: Every day | ORAL | Status: DC

## 2014-11-06 MED ORDER — POTASSIUM CHLORIDE CRYS ER 20 MEQ PO TBCR: 40.0000 meq | EXTENDED_RELEASE_TABLET | Freq: Every day | ORAL | Status: DC

## 2014-11-06 MED ORDER — AMLODIPINE BESYLATE 10 MG PO TABS: ORAL_TABLET | ORAL | Status: DC

## 2014-11-06 MED ORDER — LISINOPRIL 20 MG PO TABS: ORAL_TABLET | ORAL | Status: DC

## 2014-11-06 NOTE — Patient Instructions (Signed)
It was great to see you again today!  See the handout on how to schedule your colonoscopy. This is an important screening test for colon cancer.  We will get the mammogram records We did your pap smear and tetanus shot today  I will call you if your test results are not normal.  Otherwise, I will send you a letter.  If you do not hear from me with in 2 weeks please call our office.     Please return in 2 weeks for a nurse BP check to make sure your BP is better while on your medicines. I sent in refills.  Checking ultrasound of leg to make sure no blood clot.  Be well, Dr. Ardelia Mems

## 2014-11-06 NOTE — Progress Notes (Signed)
Patient ID: Annette Cabrera, female   DOB: 1960/11/28, 54 y.o.   MRN: [id number] HPI:  Patient presents today for a well woman exam.   Concerns today: see below Periods: none, does novasure, still gets cramps Contraception: had novasure Pelvic symptoms: no pain or discharge Sexual activity: not sexually active x years STD Screening: declines Pap smear status: due today Exercise: none Diet: trying watch what she eats Smoking: none Alcohol: none Drugs: none Advance directives: full code  L foot swollen x 2 weeks. Especially after standing. Drove 20 hours recently.  HTN - out of medicines for a few days. Previously was taking lisinopril, HCTZ, amlodipine and potassium. Denies CP or SOB.  ROS: See HPI. Back pain when holds urine, gets better when she urinates. No dysuria, fever. Stooling normally. No LE weakness or crotch numbness.  [place]:  Cancers in family: dad had lung cancer (he was a smoker)  PHYSICAL EXAM: BP 179/104 mmHg  Pulse 93  Temp(Src) 98 F (36.7 C) (Oral)  Ht 5\' 2"  (1.575 m)  Wt 266 lb 9.6 oz (120.929 kg)  BMI 48.75 kg/m2 Gen: NAD, pleasant, cooperative HEENT: NCAT, PERRL, no palpable thyromegaly or anterior cervical lymphadenopathy Heart: RRR, no murmurs Lungs: CTAB, NWOB Abdomen: soft, nontender to palpation Neuro: grossly nonfocal, speech normal GU: normal appearing external genitalia without lesions. Vagina is moist with white discharge. Cervix normal in appearance. No cervical motion tenderness or tenderness on bimanual exam. No adnexal masses.  Ext: mild L foot swelling without erythema, warmth, or tenderness to palpation  ASSESSMENT/PLAN:  1. Health maintenance:  -STD screening: declines -pap smear: done today -mammogram: request submitted to obtain mammogram  -lipid screening: check lipids & CMET today -advance directives: full code -colonoscopy: handout given on how to schedule -HIV screening: discussed with patient today, she declined  this -immunizations: given Tdap today -handout given on health maintenance topics  2. L foot swelling: recent travel raises risk for DVT. Will obtain LLE doppler to rule out DVT.  3. HTN - BP elevated but out of medications. Will resume & have pt return in 2 weeks for BP recheck. Will need BMET after that to ensure Cr & K stable while on these medications.  FOLLOW UP: F/u in 2 weeks for RN BP check & BMET  [place] J. Ardelia Mems, [place]

## 2014-11-07 ENCOUNTER — Ambulatory Visit (HOSPITAL_COMMUNITY)
Admission: RE | Admit: 2014-11-07 | Discharge: 2014-11-07 | Disposition: A | Discharge status: Home / Self Care | Payer: Managed Care, Other (non HMO) | Source: Ambulatory Visit | Attending: Family Medicine | Admitting: Family Medicine

## 2014-11-07 DIAGNOSIS — M7989 Other specified soft tissue disorders: Secondary | ICD-10-CM | POA: Insufficient documentation

## 2014-11-07 LAB — CYTOLOGY - PAP

## 2014-11-07 NOTE — Progress Notes (Signed)
*  Preliminary Results* Left lower extremity venous duplex completed. Left lower extremity is negative for deep vein thrombosis. There is no evidence of left Baker's cyst.  11/07/2014 10:54 AM  Maudry Mayhew, RVT, RDCS, RDMS

## 2014-11-10 NOTE — Assessment & Plan Note (Signed)
BP elevated but out of medications. Will resume & have pt return in 2 weeks for BP recheck. Will need BMET after that to ensure Cr & K stable while on these medications.

## 2014-11-14 ENCOUNTER — Encounter: Payer: Self-pay | Admitting: Family Medicine

## 2014-12-24 ENCOUNTER — Other Ambulatory Visit: Payer: Managed Care, Other (non HMO)

## 2014-12-24 DIAGNOSIS — I10 Essential (primary) hypertension: Secondary | ICD-10-CM

## 2014-12-24 LAB — BASIC METABOLIC PANEL
BUN: 17 mg/dL (ref 6–23)
CALCIUM: 9.4 mg/dL (ref 8.4–10.5)
CO2: 25 mEq/L (ref 19–32)
Chloride: 106 mEq/L (ref 96–112)
Creat: 0.63 mg/dL (ref 0.50–1.10)
Glucose, Bld: 106 mg/dL — ABNORMAL HIGH (ref 70–99)
Potassium: 3.7 mEq/L (ref 3.5–5.3)
Sodium: 141 mEq/L (ref 135–145)

## 2014-12-24 NOTE — Progress Notes (Signed)
Bmp done today Annette Cabrera 

## 2015-01-01 ENCOUNTER — Ambulatory Visit: Payer: Managed Care, Other (non HMO) | Admitting: Family Medicine

## 2015-01-05 ENCOUNTER — Encounter: Payer: Self-pay | Admitting: Family Medicine

## 2015-03-02 ENCOUNTER — Other Ambulatory Visit: Payer: Self-pay | Admitting: Family Medicine

## 2015-03-03 ENCOUNTER — Telehealth: Payer: Self-pay | Admitting: Family Medicine

## 2015-03-03 NOTE — Telephone Encounter (Signed)
To [facility] red team - please call pt and let her know I have refilled one month of her meds but she needs to schedule an appointment to follow up on her blood pressure in order to receive more refills. Thanks!  Leeanne Rio, MD

## 2015-03-04 NOTE — Telephone Encounter (Signed)
Tried calling patient x 2, no answer, no voicemail. 

## 2015-03-06 NOTE — Telephone Encounter (Signed)
Called again no answer no voicemail

## 2015-03-17 ENCOUNTER — Other Ambulatory Visit: Payer: Self-pay | Admitting: Family Medicine

## 2015-04-08 DIAGNOSIS — Z23 Encounter for immunization: Secondary | ICD-10-CM | POA: Diagnosis not present

## 2015-07-31 ENCOUNTER — Emergency Department (HOSPITAL_BASED_OUTPATIENT_CLINIC_OR_DEPARTMENT_OTHER): Payer: Managed Care, Other (non HMO)

## 2015-07-31 ENCOUNTER — Emergency Department (HOSPITAL_BASED_OUTPATIENT_CLINIC_OR_DEPARTMENT_OTHER)
Admission: EM | Admit: 2015-07-31 | Discharge: 2015-07-31 | Disposition: A | Discharge status: Home / Self Care | Payer: Managed Care, Other (non HMO) | Attending: Physician Assistant | Admitting: Physician Assistant

## 2015-07-31 ENCOUNTER — Encounter (HOSPITAL_BASED_OUTPATIENT_CLINIC_OR_DEPARTMENT_OTHER): Payer: Self-pay | Admitting: *Deleted

## 2015-07-31 DIAGNOSIS — S7002XA Contusion of left hip, initial encounter: Secondary | ICD-10-CM | POA: Insufficient documentation

## 2015-07-31 DIAGNOSIS — Z8669 Personal history of other diseases of the nervous system and sense organs: Secondary | ICD-10-CM | POA: Insufficient documentation

## 2015-07-31 DIAGNOSIS — S4992XA Unspecified injury of left shoulder and upper arm, initial encounter: Secondary | ICD-10-CM | POA: Diagnosis not present

## 2015-07-31 DIAGNOSIS — M25552 Pain in left hip: Secondary | ICD-10-CM

## 2015-07-31 DIAGNOSIS — Y9389 Activity, other specified: Secondary | ICD-10-CM | POA: Insufficient documentation

## 2015-07-31 DIAGNOSIS — I10 Essential (primary) hypertension: Secondary | ICD-10-CM | POA: Diagnosis not present

## 2015-07-31 DIAGNOSIS — S80212A Abrasion, left knee, initial encounter: Secondary | ICD-10-CM | POA: Diagnosis not present

## 2015-07-31 DIAGNOSIS — W108XXA Fall (on) (from) other stairs and steps, initial encounter: Secondary | ICD-10-CM | POA: Insufficient documentation

## 2015-07-31 DIAGNOSIS — W19XXXA Unspecified fall, initial encounter: Secondary | ICD-10-CM

## 2015-07-31 DIAGNOSIS — S80812A Abrasion, left lower leg, initial encounter: Secondary | ICD-10-CM | POA: Insufficient documentation

## 2015-07-31 DIAGNOSIS — Y9289 Other specified places as the place of occurrence of the external cause: Secondary | ICD-10-CM | POA: Insufficient documentation

## 2015-07-31 DIAGNOSIS — M25572 Pain in left ankle and joints of left foot: Secondary | ICD-10-CM

## 2015-07-31 DIAGNOSIS — Y998 Other external cause status: Secondary | ICD-10-CM | POA: Diagnosis not present

## 2015-07-31 DIAGNOSIS — S99912A Unspecified injury of left ankle, initial encounter: Secondary | ICD-10-CM | POA: Diagnosis not present

## 2015-07-31 DIAGNOSIS — Z880 Allergy status to penicillin: Secondary | ICD-10-CM | POA: Insufficient documentation

## 2015-07-31 DIAGNOSIS — M25512 Pain in left shoulder: Secondary | ICD-10-CM

## 2015-07-31 MED ORDER — NAPROXEN 500 MG PO TABS: 500.0000 mg | ORAL_TABLET | Freq: Two times a day (BID) | ORAL | Status: DC

## 2015-07-31 MED FILL — NAPROXEN 500 MG TABLET: 500 | 15 days supply | Qty: 30 | Fill #0

## 2015-07-31 NOTE — Discharge Instructions (Signed)
Take the prescribed medication as directed.  May wish to ice and elevate ankle at home to help with pain/swelling. Wear ace wrap for comfort, may remove when ankle feels better. Follow-up with your primary care physician for any ongoing issues. Return to the ED for new or worsening symptoms.

## 2015-07-31 NOTE — ED Notes (Signed)
C/o fall this am down 2 steps and twisted left ankle. C/o left hip, shoulder, leg pain. No LOC. Onset this am at 0815.

## 2015-07-31 NOTE — ED Provider Notes (Signed)
CSN: [id number]     Arrival date & time 07/31/15  [id number] History   First MD Initiated Contact with Patient 07/31/15 [phone]     Chief Complaint  Patient presents with  . Fall     (Consider location/radiation/quality/duration/timing/severity/associated sxs/prior Treatment) Patient is a 55 y.o. female presenting with fall. The history is provided by the patient and medical records.  Fall Associated symptoms include arthralgias.    55 year old female with history of hypertension and Mnire's disease, presenting to the ED after a fall. Patient states she was going into work. She states she usually parks in the garage and walks down the concrete stairs for extra exercise. She states she was almost all the way down the stairs when she slipped on what she thought was a small branch or some leaves which caused her to slip and fall down the last 2 stairs.  She states she held onto the railing so she did not hit her head or lose consciousness. Patient states she did twist her left ankle and fell onto her left hip.  She states she thinks she "pulled" her left shoulder because she was holding onto the railing. Patient states she was able to ambulate after the accident, however it is very painful to bear weight on her left leg. She currently denies any neck or back pain.  No numbness or weakness of extremities.  Some ladies in her office did apply bandaids to some abrasions on her leg and applied ice to ankle but encouraged her to come here for x-rays.  VSS.  Past Medical History  Diagnosis Date  . Hypertension   . Meniere's disease    Past Surgical History  Procedure Laterality Date  . Novasure ablation    . Tubal ligation     Family History  Problem Relation Age of Onset  . Diabetes Mother   . Cancer Father   . Diabetes Father   . Cancer Sister   . Diabetes Brother    Social History  Substance Use Topics  . Smoking status: Never Smoker   . Smokeless tobacco: None  . Alcohol Use: No   OB  History    No data available     Review of Systems  Musculoskeletal: Positive for arthralgias.  All other systems reviewed and are negative.     Allergies  Penicillins  Home Medications   Prior to Admission medications   Medication Sig Start Date End Date Taking? Authorizing Provider  amLODipine (NORVASC) 10 MG tablet TAKE 1 TABLET BY MOUTH EVERY DAY 03/18/15  Yes Leeanne Rio, MD  hydrochlorothiazide (HYDRODIURIL) 25 MG tablet TAKE 1 TABLET BY MOUTH EVERY DAY 03/18/15  Yes Leeanne Rio, MD  KLOR-CON M20 20 MEQ tablet TAKE 2 TABLETS BY MOUTH EVERY DAY 03/18/15  Yes Leeanne Rio, MD  lisinopril (PRINIVIL,ZESTRIL) 20 MG tablet TAKE 1 TABLET BY MOUTH EVERY DAY 03/18/15  Yes Leeanne Rio, MD   BP 147/84 mmHg  Pulse 93  Temp(Src) 98.1 F (36.7 C) (Oral)  Resp 18  Ht 5\' 3"  (1.6 m)  Wt 108.863 kg  BMI 42.52 kg/m2  SpO2 98%   Physical Exam  Constitutional: She is oriented to person, place, and time. She appears well-developed and well-nourished. No distress.  Obese; Awake, alert, no distress  HENT:  Head: Normocephalic and atraumatic.  Mouth/Throat: Oropharynx is clear and moist.  No signs of head trauma  Eyes: Conjunctivae and EOM are normal. Pupils are equal, round, and reactive to light.  Neck:  Normal range of motion. Neck supple.  Cardiovascular: Normal rate, regular rhythm and normal heart sounds.   Pulmonary/Chest: Effort normal and breath sounds normal. No respiratory distress. She has no wheezes.  Abdominal: Soft. Bowel sounds are normal.  Musculoskeletal: Normal range of motion.  - Small bruise noted to left lateral hip; locally TTP; no bony deformity or leg shortening - Abrasion noted to left knee and left shin, non-tender - Left ankle with swelling noted surrounding lateral malleolus; no gross deformity noted; limited ROM due to pain; moving all toes appropriately; DP pulse intact, normal cap refill; normal sensation - left shoulder with  some mild tenderness along posterior aspect; does appear to be some spasm along trapezius muscle; no bony deformity or signs of dislocation; normal grip strength of left arm; normal sensation; strong radial pulse  Neurological: She is alert and oriented to person, place, and time.  AAOx3, answering questions appropriately; equal strength UE and LE bilaterally; CN grossly intact; moves all extremities appropriately without ataxia; no focal neuro deficits or facial asymmetry appreciated  Skin: Skin is warm and dry. She is not diaphoretic.  Psychiatric: She has a normal mood and affect.  Nursing note and vitals reviewed.   ED Course  Procedures (including critical care time) Labs Review Labs Reviewed - No data to display  Imaging Review Dg Ankle Complete Left  07/31/2015  CLINICAL DATA:  Pt fell down the steps this am and is having left shoulder,hip and ankle pain EXAM: LEFT ANKLE COMPLETE - 3+ VIEW COMPARISON:  None. FINDINGS: There is no evidence of fracture, dislocation, or joint effusion. Corticated ossicle adjacent to the medial malleolus. Small calcaneal spurs. There is no evidence of arthropathy or other focal bone abnormality. Soft tissues are unremarkable. IMPRESSION: No acute abnormality Electronically Signed   By: Lucrezia Europe M.D.   On: 07/31/2015 10:26   Dg Shoulder Left  07/31/2015  CLINICAL DATA:  Fall on stairs this morning.  Left shoulder pain. EXAM: LEFT SHOULDER - 2+ VIEW COMPARISON:  06/05/2012 FINDINGS: Mild degenerative AC joint spurring without malalignment. Mild spurring of the inferior glenoid. Difficult to exclude an os acromiale but os acromiale is not confirmed on the axillary view. No dislocation or fracture identified. IMPRESSION: 1. No acute findings. 2. Degenerative AC joint arthropathy. 3. Questionable os acromiale, although not confirmed on the axillary projection. Electronically Signed   By: Van Clines M.D.   On: 07/31/2015 09:49   Dg Hip Unilat With Pelvis  2-3 Views Left  07/31/2015  CLINICAL DATA:  Status post fall.  Left shoulder and hip pain. EXAM: DG HIP (WITH OR WITHOUT PELVIS) 2-3V LEFT COMPARISON:  None. FINDINGS: There is no evidence of hip fracture or dislocation. There is no evidence of arthropathy or other focal bone abnormality. IMPRESSION: Negative. Electronically Signed   By: Kathreen Devoid   On: 07/31/2015 09:46   I have personally reviewed and evaluated these images and lab results as part of my medical decision-making.   EKG Interpretation None      MDM   Final diagnoses:  Fall, initial encounter  Left ankle pain  Left hip pain  Left shoulder pain   55 year old female here after mechanical fall on concrete stairs. No head injury or loss of consciousness. Patient is awake, alert, appropriately oriented currently. She reports pain of her left ankle, left hip, and left shoulder. She is no acute bony deformities on exam. She does have a bruise of her left hip is a mild swelling over lateral  left ankle. X-rays were obtained without evidence of acute fracture or dislocation. Ace wrap was applied to left ankle for comfort. Patient started on Naprosyn, encouraged ice and elevation at home to help with pain/swelling.  Follow-up with PCP.  Discussed plan with patient, he/she acknowledged understanding and agreed with plan of care.  Return precautions given for new or worsening symptoms.  Larene Pickett, PA-C 07/31/15 [place], MD 07/31/15 1545

## 2015-08-11 ENCOUNTER — Other Ambulatory Visit: Payer: Self-pay | Admitting: Family Medicine

## 2015-09-06 ENCOUNTER — Observation Stay (HOSPITAL_COMMUNITY)
Admission: EM | Admit: 2015-09-06 | Discharge: 2015-09-07 | Disposition: A | Discharge status: Home / Self Care | Payer: Managed Care, Other (non HMO) | Attending: Family Medicine | Admitting: Family Medicine

## 2015-09-06 ENCOUNTER — Emergency Department (HOSPITAL_COMMUNITY): Payer: Managed Care, Other (non HMO)

## 2015-09-06 ENCOUNTER — Encounter (HOSPITAL_COMMUNITY): Payer: Self-pay | Admitting: Emergency Medicine

## 2015-09-06 ENCOUNTER — Other Ambulatory Visit: Payer: Self-pay | Admitting: Family Medicine

## 2015-09-06 DIAGNOSIS — R7301 Impaired fasting glucose: Secondary | ICD-10-CM | POA: Diagnosis not present

## 2015-09-06 DIAGNOSIS — Z6841 Body Mass Index (BMI) 40.0 and over, adult: Secondary | ICD-10-CM | POA: Insufficient documentation

## 2015-09-06 DIAGNOSIS — L03116 Cellulitis of left lower limb: Secondary | ICD-10-CM | POA: Diagnosis not present

## 2015-09-06 DIAGNOSIS — L0390 Cellulitis, unspecified: Secondary | ICD-10-CM | POA: Diagnosis present

## 2015-09-06 DIAGNOSIS — E876 Hypokalemia: Secondary | ICD-10-CM | POA: Diagnosis not present

## 2015-09-06 DIAGNOSIS — E872 Acidosis: Secondary | ICD-10-CM | POA: Insufficient documentation

## 2015-09-06 DIAGNOSIS — R7309 Other abnormal glucose: Secondary | ICD-10-CM | POA: Diagnosis not present

## 2015-09-06 DIAGNOSIS — H8109 Meniere's disease, unspecified ear: Secondary | ICD-10-CM | POA: Diagnosis not present

## 2015-09-06 DIAGNOSIS — R21 Rash and other nonspecific skin eruption: Secondary | ICD-10-CM

## 2015-09-06 DIAGNOSIS — Z880 Allergy status to penicillin: Secondary | ICD-10-CM | POA: Insufficient documentation

## 2015-09-06 DIAGNOSIS — M7989 Other specified soft tissue disorders: Secondary | ICD-10-CM | POA: Diagnosis not present

## 2015-09-06 DIAGNOSIS — E669 Obesity, unspecified: Secondary | ICD-10-CM | POA: Diagnosis not present

## 2015-09-06 DIAGNOSIS — I10 Essential (primary) hypertension: Secondary | ICD-10-CM

## 2015-09-06 LAB — BASIC METABOLIC PANEL
Anion gap: 15 (ref 5–15)
BUN: 7 mg/dL (ref 6–20)
CALCIUM: 9.1 mg/dL (ref 8.9–10.3)
CO2: 26 mmol/L (ref 22–32)
CREATININE: 0.58 mg/dL (ref 0.44–1.00)
Chloride: 102 mmol/L (ref 101–111)
Glucose, Bld: 144 mg/dL — ABNORMAL HIGH (ref 65–99)
Potassium: 3.2 mmol/L — ABNORMAL LOW (ref 3.5–5.1)
SODIUM: 143 mmol/L (ref 135–145)

## 2015-09-06 LAB — CBC WITH DIFFERENTIAL/PLATELET
BASOS PCT: 0 %
Basophils Absolute: 0 10*3/uL (ref 0.0–0.1)
EOS ABS: 0.6 10*3/uL (ref 0.0–0.7)
EOS PCT: 7 %
HCT: 40.9 % (ref 36.0–46.0)
Hemoglobin: 13.1 g/dL (ref 12.0–15.0)
LYMPHS ABS: 3 10*3/uL (ref 0.7–4.0)
Lymphocytes Relative: 39 %
MCH: 27.6 pg (ref 26.0–34.0)
MCHC: 32 g/dL (ref 30.0–36.0)
MCV: 86.1 fL (ref 78.0–100.0)
Monocytes Absolute: 0.5 10*3/uL (ref 0.1–1.0)
Monocytes Relative: 6 %
Neutro Abs: 3.7 10*3/uL (ref 1.7–7.7)
Neutrophils Relative %: 48 %
PLATELETS: 292 10*3/uL (ref 150–400)
RBC: 4.75 MIL/uL (ref 3.87–5.11)
RDW: 12.9 % (ref 11.5–15.5)
WBC: 7.7 10*3/uL (ref 4.0–10.5)

## 2015-09-06 LAB — C-REACTIVE PROTEIN: CRP: 0.8 mg/dL (ref <1.0)

## 2015-09-06 LAB — I-STAT CG4 LACTIC ACID, ED: Lactic Acid, Venous: 2.62 mmol/L (ref 0.5–2.0)

## 2015-09-06 LAB — LACTIC ACID, PLASMA: LACTIC ACID, VENOUS: 1.5 mmol/L (ref 0.5–2.0)

## 2015-09-06 LAB — SEDIMENTATION RATE: SED RATE: 14 mm/h (ref 0–22)

## 2015-09-06 MED ORDER — LISINOPRIL 20 MG PO TABS
20.0000 mg | ORAL_TABLET | Freq: Every day | ORAL | Status: DC
  Administered 2015-09-07: 20 mg via ORAL
  Filled 2015-09-06: qty 1

## 2015-09-06 MED ORDER — HYDROCHLOROTHIAZIDE 25 MG PO TABS
25.0000 mg | ORAL_TABLET | Freq: Every day | ORAL | Status: DC
  Administered 2015-09-07: 25 mg via ORAL
  Filled 2015-09-06: qty 1

## 2015-09-06 MED ORDER — DOXYCYCLINE HYCLATE 100 MG PO TABS
100.0000 mg | ORAL_TABLET | Freq: Two times a day (BID) | ORAL | Status: DC
  Administered 2015-09-07: 100 mg via ORAL
  Filled 2015-09-06: qty 1

## 2015-09-06 MED ORDER — ACETAMINOPHEN 325 MG PO TABS: 650.0000 mg | ORAL_TABLET | Freq: Four times a day (QID) | ORAL | Status: DC | PRN

## 2015-09-06 MED ORDER — DIPHENHYDRAMINE HCL 25 MG PO CAPS
25.0000 mg | ORAL_CAPSULE | Freq: Once | ORAL | Status: AC
  Administered 2015-09-06: 25 mg via ORAL
  Filled 2015-09-06: qty 1

## 2015-09-06 MED ORDER — CLOTRIMAZOLE 1 % EX CREA
TOPICAL_CREAM | Freq: Two times a day (BID) | CUTANEOUS | Status: DC
  Filled 2015-09-06: qty 15

## 2015-09-06 MED ORDER — ENOXAPARIN SODIUM 40 MG/0.4ML ~~LOC~~ SOLN
40.0000 mg | SUBCUTANEOUS | Status: DC
  Administered 2015-09-06 – 2015-09-07 (×2): 40 mg via SUBCUTANEOUS
  Filled 2015-09-06 (×2): qty 0.4

## 2015-09-06 MED ORDER — VANCOMYCIN HCL IN DEXTROSE 1-5 GM/200ML-% IV SOLN
1000.0000 mg | Freq: Once | INTRAVENOUS | Status: AC
  Administered 2015-09-06: 1000 mg via INTRAVENOUS
  Filled 2015-09-06: qty 200

## 2015-09-06 MED ORDER — TERBINAFINE HCL 1 % EX CREA
TOPICAL_CREAM | Freq: Two times a day (BID) | CUTANEOUS | Status: DC
  Administered 2015-09-06 – 2015-09-07 (×2): via TOPICAL
  Filled 2015-09-06 (×2): qty 12

## 2015-09-06 MED ORDER — SODIUM CHLORIDE 0.9 % IV BOLUS (SEPSIS)
1000.0000 mL | Freq: Once | INTRAVENOUS | Status: AC
  Administered 2015-09-06: 1000 mL via INTRAVENOUS

## 2015-09-06 MED ORDER — AMLODIPINE BESYLATE 10 MG PO TABS
10.0000 mg | ORAL_TABLET | Freq: Every day | ORAL | Status: DC
  Administered 2015-09-07: 10 mg via ORAL
  Filled 2015-09-06: qty 1

## 2015-09-06 MED ORDER — SODIUM CHLORIDE 0.9 % IV SOLN
INTRAVENOUS | Status: DC
  Administered 2015-09-06: 10:00:00 via INTRAVENOUS

## 2015-09-06 MED ORDER — ACETAMINOPHEN 650 MG RE SUPP: 650.0000 mg | Freq: Four times a day (QID) | RECTAL | Status: DC | PRN

## 2015-09-06 MED ORDER — POLYETHYLENE GLYCOL 3350 17 G PO PACK: 17.0000 g | PACK | Freq: Every day | ORAL | Status: DC | PRN

## 2015-09-06 MED ORDER — DOXYCYCLINE HYCLATE 100 MG PO TABS: 100.0000 mg | ORAL_TABLET | Freq: Two times a day (BID) | ORAL | Status: DC

## 2015-09-06 MED ORDER — POTASSIUM CHLORIDE CRYS ER 20 MEQ PO TBCR
40.0000 meq | EXTENDED_RELEASE_TABLET | Freq: Once | ORAL | Status: AC
  Administered 2015-09-06: 40 meq via ORAL
  Filled 2015-09-06: qty 2

## 2015-09-06 MED ORDER — SODIUM CHLORIDE 0.9 % IV SOLN
1250.0000 mg | Freq: Two times a day (BID) | INTRAVENOUS | Status: DC
  Administered 2015-09-06: 1250 mg via INTRAVENOUS
  Filled 2015-09-06: qty 1250

## 2015-09-06 MED ORDER — SODIUM CHLORIDE 0.9 % IV SOLN
INTRAVENOUS | Status: DC
  Administered 2015-09-06: 12:00:00 via INTRAVENOUS

## 2015-09-06 MED ORDER — DIPHENHYDRAMINE HCL 25 MG PO CAPS
50.0000 mg | ORAL_CAPSULE | Freq: Three times a day (TID) | ORAL | Status: DC | PRN
  Administered 2015-09-06 – 2015-09-07 (×2): 50 mg via ORAL
  Filled 2015-09-06 (×2): qty 2

## 2015-09-06 NOTE — ED Notes (Signed)
Pt with chief complaint of left shin injury and body hives. Pt sustained her leg injury on Feb 10th at work. Pt's hives started last Wed and have gotten progressively worse over the last few  days.

## 2015-09-06 NOTE — Evaluation (Signed)
Physical Therapy Evaluation and Discharge Patient Details Name: Annette Cabrera MRN: [id number] DOB: 1961/01/18 Today's Date: 09/06/2015   History of Present Illness  Pt is a 55 y/o F admitted w/ cellulitis after a reported fall w/ resultant sprained ankle and laceration at the end of 2/17.  Pt's PMH includes Meniere's Disease.    Clinical Impression  Pt admitted with above diagnosis. Pt currently with functional limitations due to the deficits listed below (see PT Problem List).  Annette Cabrera presents w/ Lt ankle sprain following fall on steps at the end of 2/17.  Pt educated on icing Lt ankle to relieve 1/10 pain reported and for light AROM exercise as documented below.  No skilled acute PT needs identified, all further PT needs can be met in the OPPT setting. PT is signing off.    Follow Up Recommendations Outpatient PT (to address Lt ankle sprain)    Equipment Recommendations  None recommended by PT    Recommendations for Other Services       Precautions / Restrictions Precautions Precaution Comments: Lt ankle sprain Restrictions Weight Bearing Restrictions: No      Mobility  Bed Mobility Overal bed mobility: Independent             General bed mobility comments: no cues or physical assist needed  Transfers Overall transfer level: Independent               General transfer comment: no cues or physical assist needed  Ambulation/Gait Ambulation/Gait assistance: Modified independent (Device/Increase time) Ambulation Distance (Feet): 400 Feet Assistive device: None Gait Pattern/deviations: Step-through pattern;Decreased weight shift to left;Antalgic   Gait velocity interpretation: at or above normal speed for age/gender General Gait Details: Antalgic gait due to 1/10 Lt ankle pain. Pt steady w/ high level balance activities as documented below.   Stairs Stairs: Yes Stairs assistance: Modified independent (Device/Increase time) Stair Management: No  rails;Step to pattern;Forwards Number of Stairs: 2 General stair comments: Leads up w/ Rt foot and down w/ Lt foot.  No cues or physical assist needed.  Wheelchair Mobility    Modified Rankin (Stroke Patients Only)       Balance Overall balance assessment: Modified Independent                           High level balance activites: Turns;Direction changes;Backward walking;Head turns High Level Balance Comments: No instability w/ high level balance activities             Pertinent Vitals/Pain Pain Assessment: 0-10 Pain Score: 1  Pain Location: Lt ankle w/ inversion Pain Descriptors / Indicators: Aching Pain Intervention(s): Limited activity within patient's tolerance;Monitored during session    Home Living Family/patient expects to be discharged to:: Private residence Living Arrangements: Children Available Help at Discharge: Family;Available PRN/intermittently Type of Home: House Home Access: Stairs to enter Entrance Stairs-Rails: None Entrance Stairs-Number of Steps: 3 Home Layout: One level Home Equipment: None      Prior Function Level of Independence: Independent         Comments: Works in Electrical engineer        Extremity/Trunk Assessment   Upper Extremity Assessment: Overall WFL for tasks assessed           Lower Extremity Assessment: LLE deficits/detail   LLE Deficits / Details: Cellulitis and edema Lt LE w/ Lt ankle sprain; painful w/ inversion, otherwise strength WNL     Communication  Communication: No difficulties  Cognition Arousal/Alertness: Awake/alert Behavior During Therapy: WFL for tasks assessed/performed Overall Cognitive Status: Within Functional Limits for tasks assessed                      General Comments      Exercises Other Exercises Other Exercises: Encouraged pt to perform Lt ankle AROM in all motions to her tolerance, limiting inversion in response to pain and to apply  ice throughout the day.      Assessment/Plan    PT Assessment All further PT needs can be met in the next venue of care  PT Diagnosis Difficulty walking   PT Problem List Decreased strength;Decreased range of motion;Pain  PT Treatment Interventions     PT Goals (Current goals can be found in the Care Plan section) Acute Rehab PT Goals Patient Stated Goal: to go home and get back to work PT Goal Formulation: All assessment and education complete, DC therapy    Frequency     Barriers to discharge        Co-evaluation               End of Session   Activity Tolerance: Patient tolerated treatment well Patient left: in bed;with call bell/phone within reach Nurse Communication: Mobility status    Functional Assessment Tool Used: Clinical Judgement Functional Limitation: Mobility: Walking and moving around Mobility: Walking and Moving Around Current Status (Y5859): At least 1 percent but less than 20 percent impaired, limited or restricted Mobility: Walking and Moving Around Goal Status [phone]): At least 1 percent but less than 20 percent impaired, limited or restricted Mobility: Walking and Moving Around Discharge Status [phone]): At least 1 percent but less than 20 percent impaired, limited or restricted    Time: 8177-1165 PT Time Calculation (min) (ACUTE ONLY): 16 min   Charges:   PT Evaluation $PT Eval Low Complexity: 1 Procedure     PT G Codes:   PT G-Codes **NOT FOR INPATIENT CLASS** Functional Assessment Tool Used: Clinical Judgement Functional Limitation: Mobility: Walking and moving around Mobility: Walking and Moving Around Current Status (B9038): At least 1 percent but less than 20 percent impaired, limited or restricted Mobility: Walking and Moving Around Goal Status [phone]): At least 1 percent but less than 20 percent impaired, limited or restricted Mobility: Walking and Moving Around Discharge Status [phone]): At least 1 percent but less than 20 percent  impaired, limited or restricted    Collie Siad PT, DPT  Pager: [phone] Phone: [phone] 09/06/2015, 3:58 PM

## 2015-09-06 NOTE — ED Provider Notes (Signed)
CSN: [id number]     Arrival date & time 09/06/15  [id number] History   First MD Initiated Contact with Patient 09/06/15 [phone]     Chief Complaint  Patient presents with  . Leg Injury     (Consider location/radiation/quality/duration/timing/severity/associated sxs/prior Treatment) HPI Comments: Patient presents with pain and redness and swelling to her left shin for the past 4 days. She had a fall with abrasion to her legs 2 months ago and has been seen in hospital as well as her Worker's Comp. doctor multiple times. She was treated with doxycycline initially which she stopped taking several weeks ago. Redness and swelling of her leg or worsening over the past 3 days. No new trauma. No fever. No vomiting. She also is having a diffuse pruritic rash involving her arms and legs. She denies any new Exposures. No new Soaps, lotions or detergents. The rash does not hurt. There are no oral lesions or genital lesions. Last dose of doxycycline was about 10 days ago.  The history is provided by the patient.    Past Medical History  Diagnosis Date  . Hypertension   . Meniere's disease    Past Surgical History  Procedure Laterality Date  . Novasure ablation    . Tubal ligation     Family History  Problem Relation Age of Onset  . Diabetes Mother   . Cancer Father   . Diabetes Father   . Cancer Sister   . Diabetes Brother    Social History  Substance Use Topics  . Smoking status: Never Smoker   . Smokeless tobacco: None  . Alcohol Use: No   OB History    No data available     Review of Systems  Constitutional: Negative for fever, activity change and appetite change.  HENT: Negative for congestion and rhinorrhea.   Respiratory: Negative for cough, chest tightness and shortness of breath.   Cardiovascular: Positive for leg swelling. Negative for chest pain.  Gastrointestinal: Negative for nausea, vomiting and abdominal pain.  Genitourinary: Negative for dysuria, hematuria, vaginal bleeding  and vaginal discharge.  Musculoskeletal: Positive for myalgias and arthralgias. Negative for back pain.  Skin: Positive for rash.  Neurological: Negative for dizziness, weakness, light-headedness and headaches.   A complete 10 system review of systems was obtained and all systems are negative except as noted in the HPI and PMH.     Allergies  Penicillins  Home Medications   Prior to Admission medications   Medication Sig Start Date End Date Taking? Authorizing Provider  amLODipine (NORVASC) 10 MG tablet TAKE 1 TABLET BY MOUTH EVERY DAY 03/18/15  Yes Leeanne Rio, MD  diphenhydrAMINE (BENADRYL) 25 MG tablet Take 50 mg by mouth every 6 (six) hours as needed for allergies.   Yes Historical Provider, MD  fexofenadine (ALLEGRA) 180 MG tablet Take 180 mg by mouth daily.   Yes Historical Provider, MD  hydrochlorothiazide (HYDRODIURIL) 25 MG tablet TAKE 1 TABLET BY MOUTH EVERY DAY 03/18/15  Yes Leeanne Rio, MD  KLOR-CON M20 20 MEQ tablet TAKE 2 TABLETS BY MOUTH EVERY DAY 03/18/15  Yes Leeanne Rio, MD  lisinopril (PRINIVIL,ZESTRIL) 20 MG tablet TAKE 1 TABLET BY MOUTH EVERY DAY 03/18/15  Yes Leeanne Rio, MD   BP 160/81 mmHg  Pulse 90  Temp(Src) 97.8 F (36.6 C) (Oral)  Resp 16  SpO2 99% Physical Exam  Constitutional: She is oriented to person, place, and time. She appears well-developed and well-nourished. No distress.  HENT:  Head: Normocephalic  and atraumatic.  Mouth/Throat: Oropharynx is clear and moist. No oropharyngeal exudate.  Eyes: Conjunctivae and EOM are normal. Pupils are equal, round, and reactive to light.  Neck: Normal range of motion. Neck supple.  No meningismus.  Cardiovascular: Normal rate, regular rhythm, normal heart sounds and intact distal pulses.   No murmur heard. Pulmonary/Chest: Effort normal and breath sounds normal. No respiratory distress.  Abdominal: Soft. There is no tenderness. There is no rebound and no guarding.    Musculoskeletal: Normal range of motion. She exhibits edema and tenderness.  Edema and erythema to R shin at site of previous abrasion, warm.  Calf soft. Intact DP and PT pulses  Neurological: She is alert and oriented to person, place, and time. No cranial nerve deficit. She exhibits normal muscle tone. Coordination normal.  No ataxia on finger to nose bilaterally. No pronator drift. 5/5 strength throughout. CN 2-12 intact.Equal grip strength. Sensation intact.   Skin: Skin is warm. Rash noted.  Erythematous and pruritic maculopapular rash to bilateral lower extremities, upper extremities. +blanchable.  Psychiatric: She has a normal mood and affect. Her behavior is normal.  Nursing note and vitals reviewed.       ED Course  Procedures (including critical care time) Labs Review Labs Reviewed  BASIC METABOLIC PANEL - Abnormal; Notable for the following:    Potassium 3.2 (*)    Glucose, Bld 144 (*)    All other components within normal limits  I-STAT CG4 LACTIC ACID, ED - Abnormal; Notable for the following:    Lactic Acid, Venous 2.62 (*)    All other components within normal limits  CULTURE, BLOOD (ROUTINE X 2)  CULTURE, BLOOD (ROUTINE X 2)  CBC WITH DIFFERENTIAL/PLATELET  C-REACTIVE PROTEIN  SEDIMENTATION RATE    Imaging Review Dg Tibia/fibula Left  09/06/2015  CLINICAL DATA:  Pain following fall EXAM: LEFT TIBIA AND FIBULA - 2 VIEW COMPARISON:  Left ankle July 31, 2015 FINDINGS: Frontal and lateral views were obtained. A small calcification just medial to the medial malleolus is stable and may represent residua of prior avulsion. There is no demonstrable acute fracture or dislocation. No erosive change or bony destruction. Joint spaces appear normal. No abnormal periosteal reaction. No soft tissue air or calcification noted. IMPRESSION: Question prior avulsion along the medial malleolus medially. No acute fracture or dislocation. No bony destruction or soft tissue air. No  abnormal periosteal reaction. Electronically Signed   By: Lowella Grip III M.D.   On: 09/06/2015 07:25   I have personally reviewed and evaluated these images and lab results as part of my medical decision-making.   EKG Interpretation None      MDM   Final diagnoses:  Cellulitis of leg without foot, left  Rash   LLE cellulitis.  Patient nontoxic and afebrile.  Patient also with rash of undetermined etiology, possibly related to doxycycline.  WBC normal, lactate slightly elevated. No thrombocytopenia.  Patient well-appearing. Uncertain etiology of rash may be related to doxycycline but she has not had this in about 10 days. There is no thrombus and. There is no evidence of DIC. Doubt meningococcemia, Stevens-Johnson syndrome, TEN.  plan to admit for IV antibiotics and IV fluids for cellulitis. Discussed with family practice residents.    Ezequiel Essex, MD 09/06/15 [phone]

## 2015-09-06 NOTE — H&P (Signed)
[facility] Admission History and Physical Service Pager: [phone]  Patient name: Annette Cabrera Medical record number: [id number] Date of birth: [birth year] Age: 55 y.o. Gender: female  Primary Care Provider: Chrisandra Netters, MD Consultants: None  Code Status: FULL (discussed on admission)  Chief Complaint: Worsening Cellulitis and Rash   Assessment and Plan: Annette Cabrera is a 55 y.o. female presenting with worsening cellulitis after recent treatment with doxycyline and a diffuse rash. PMH is significant for HTN and Meniere's Disease.   Cellulitis: Approximately 1 month time course of cellulitis with completed course of Doxycyline 2 weeks ago. Patient is afebrile and without leukocytosis. Low suspicion for systemic infection given these factors and qSOFA score of 0. Patient does have mild lactic acidosis to 2.62. S/p 1 L bolus in the ED and has received Vancomycin IV x1.  Would consider this a mild case.  Do not thinks she is refractory to Doxy.  Skin changes seem more consistent with post inflammatory changes but will treat empirically with extended course of doxy since she is allergic to PCNs.  Could consider Clinda but risk of C Diff seems to outweigh benefits at this time.  -admit to MedSurg, vital signs per unit  -1 L NS bolus ordered, then NS at 125 cc/hr  -repeat lactic acid -Vancomycin x1 then switch to Doxy -PT consult  -CBC in AM   Rash: Patient without history of exposure to wooded areas and rash not involving palms or soles; therefore low suspicion for tick-borne rash. Platelets stable at 292, without bleeding, and no mucosal involvement so doubt ITP or DIC as etiology. Does not appear consistent in appearance with Stevens-Johnson syndrome. Could be drug reaction rash to doxycyline, however less likely given no exposure to this medication for about 2 weeks. Suspect this is a benign rash related to exposure of some type of allergen during  recent vacation to [place].  Small area on her Right back looks like an insect bite but does not appear to be consistent with bed bugs. -will continue to monitor  -Benadryl 50 mg q6h for pruritis  -Lamisil cr ordered BID to AA -Consider adding oral prednisone/ topical steroid  HTN: Elevated at admission. Patient reports BPs have also been high at home prior to admission.  -continue home amlodipine 10 mg daily, HCTZ 25 mg daily, and lisinopril 20 mg daily  -continue to monitor -may need to increase home medication doses during hospital course for better BP control   Hypokalemia: K 3.2 at admission.  Has a history of this and is on Potassium outpatient  -On an ACE-I so home K held for now.  Replete PRN -Kdur 40 meq ordered -will monitor on BMET tomorrow AM and replete prn   Elevated Glucose: Glucose elevated to 144 on BMET. Given this elevation and patient's obesity, would like to evaluate further for DM. Underlying DM could also affect wound healing.  -HgB A1C ordered   Meniere's Disease: No complaints at admission.  -low salt diet  -thiazide as above  FEN/GI: NS at 125 cc/hr, Heart Healthy Diet  Prophylaxis: Lovenox sub-q  Disposition: Admit to Observation; FPTS; Attending Dr. Erin Hearing   History of Present Illness:  Annette Cabrera is a 55 y.o. female presenting with unhealing cellulitis. On 2/10 she fell and suffered a skin injury to her left shin. One week after her fall she developed an infection in this area and was started on doxycycline. She completed the entire course of doxycyline, finishing the  antibiotic about 2 weeks ago.  Lesion looked better at that time but was still red.  Has ortho referral but has not seen. Last PCP visit for this cellulitis was 3/10, waiting for worker's comp to approve. No fevers but +chills. Admits to nausea but no vomiting. No SOB.   On Wednesday (about 5 days ago) developed a rash, which subsequently worsened the next day.  Rash is present  on legs, arms, back, and buttocks. It is most prominent on her legs. Has attempted Allegra and Benadryl one time each without improvement in appearance of rash. Noticed the rash after a recent trip to [place]. Denies going in the ocean or swimming pools. Reports she has had a similar rash but it has never been this bad. She reports that she is very allergic to pollen and mosquitos. Denies hiking, new foods, new detergents, new body lotions and new soaps.   In the ED she was started on IV Vancomycin. She was also given a 1L bolus and Benadryl.   Review Of Systems: Per HPI with the following additions: None  Otherwise the remainder of the systems were negative.  Patient Active Problem List   Diagnosis Date Noted  . Cellulitis 09/06/2015  . Meniere's disease 09/16/2013  . Allergic rhinitis 05/30/2013  . Hypertension 04/24/2013    Past Medical History: Past Medical History  Diagnosis Date  . Hypertension   . Meniere's disease     Past Surgical History: Past Surgical History  Procedure Laterality Date  . Novasure ablation    . Tubal ligation      Social History: Social History  Substance Use Topics  . Smoking status: Never Smoker   . Smokeless tobacco: None  . Alcohol Use: No   Additional social history: Single mother.   Please also refer to relevant sections of EMR.  Family History: Family History  Problem Relation Age of Onset  . Diabetes Mother   . Cancer Father   . Diabetes Father   . Cancer Sister   . Diabetes Brother     Allergies and Medications: Allergies  Allergen Reactions  . Penicillins Nausea And Vomiting   No current facility-administered medications on file prior to encounter.   Current Outpatient Prescriptions on File Prior to Encounter  Medication Sig Dispense Refill  . amLODipine (NORVASC) 10 MG tablet TAKE 1 TABLET BY MOUTH EVERY DAY 30 tablet 0  . hydrochlorothiazide (HYDRODIURIL) 25 MG tablet TAKE 1 TABLET BY MOUTH EVERY DAY 30 tablet 0   . KLOR-CON M20 20 MEQ tablet TAKE 2 TABLETS BY MOUTH EVERY DAY 60 tablet 0  . lisinopril (PRINIVIL,ZESTRIL) 20 MG tablet TAKE 1 TABLET BY MOUTH EVERY DAY 30 tablet 0  . [DISCONTINUED] potassium chloride 20 MEQ TBCR Take 40 mEq by mouth daily. 60 tablet 1    Objective: BP 160/81 mmHg  Pulse 90  Temp(Src) 97.8 F (36.6 C) (Oral)  Resp 16  SpO2 99% Exam: General: Obese female lying in bed in NAD, non-toxic appearing  Eyes: EOMI. Conjunctivae not injected.  ENTM: Oropharynx clear. MMM. No oral lesions.  Neck: Normal ROM.  Cardiovascular: RRR. No murmurs appreciated. 2+ pedal pulses.  Respiratory: CTAB. Normal WOB on room air.  Abdomen: soft, NTND MSK: Moves all extremities spontaneously.  Skin: Erythematous maculopapular rash to bilateral lower extremities (most prominent on inner thighs and superolateral hips), upper extremities (prominent of antecubital fossas), and on lower back/upper buttocks. Rash is blanchable, non Tender. No palmar or sole involvement. Please see pictures below for cellulitis.  Increased warmth to area of cellulitis, non-tender to palpation, non exudative, non bleeding.  Neuro: Alert and oriented. No gross deficits.  Psych: Appropriate mood and affect.          Labs and Imaging: CBC BMET   Recent Labs Lab 09/06/15 0700  WBC 7.7  HGB 13.1  HCT 40.9  PLT 292    Recent Labs Lab 09/06/15 0700  NA 143  K 3.2*  CL 102  CO2 26  BUN 7  CREATININE 0.58  GLUCOSE 144*  CALCIUM 9.1     Dg Tibia/fibula Left  09/06/2015  CLINICAL DATA:  Pain following fall EXAM: LEFT TIBIA AND FIBULA - 2 VIEW COMPARISON:  Left ankle July 31, 2015 FINDINGS: Frontal and lateral views were obtained. A small calcification just medial to the medial malleolus is stable and may represent residua of prior avulsion. There is no demonstrable acute fracture or dislocation. No erosive change or bony destruction. Joint spaces appear normal. No abnormal periosteal reaction. No  soft tissue air or calcification noted. IMPRESSION: Question prior avulsion along the medial malleolus medially. No acute fracture or dislocation. No bony destruction or soft tissue air. No abnormal periosteal reaction. Electronically Signed   By: Lowella Grip III M.D.   On: 09/06/2015 07:25    Nicolette Bang, DO 09/06/2015, 9:30 AM PGY-1, [place] Intern pager: [phone], text pages welcome  I have separately seen and examined the patient. I have discussed the findings and exam with Dr Juleen China and agree with the above note.  My changes/additions are outlined in BLUE.   Ashly M. Lajuana Ripple, DO PGY-2, [place]

## 2015-09-06 NOTE — Progress Notes (Signed)
Patient A/O, no noted discharge. Patient noted her symptoms were worsen. Rash noted over back, abdomen, arms, thighs and chest. Patient c/o itching administered med (see EMar), relief. After admission assessment, patient stated she fell and think she sprain her ankle there is minimal edema. Patient is SL. Staff will continue to monitor and meet needs.

## 2015-09-06 NOTE — Progress Notes (Signed)
Pharmacy Antibiotic Note  Annette Cabrera is a 55 y.o. female admitted on 09/06/2015 with cellulitis.  Pharmacy has been consulted for vancomycin dosing.  Was on doxycycline PTA.   Plan: Vancomycin 1250mg  IV every 12 hours.  Goal trough 10-15 mcg/mL. Follow clinical progression, renal function, LOT    Temp (24hrs), Avg:98.2 F (36.8 C), Min:97.8 F (36.6 C), Max:98.5 F (36.9 C)   Recent Labs Lab 09/06/15 0700 09/06/15 0737  WBC 7.7  --   CREATININE 0.58  --   LATICACIDVEN  --  2.62*    CrCl cannot be calculated (Unknown ideal weight.).    Allergies  Allergen Reactions  . Penicillins Nausea And Vomiting    Antimicrobials this admission: vancomycin 3/19 >>    Dose adjustments this admission: n/a  Microbiology results: 3/19 BCx: sent   Thank you for allowing pharmacy to be a part of this patient's care.  Jordanne Elsbury D. Marelly Wehrman, PharmD, BCPS Clinical Pharmacist Pager: [phone] 09/06/2015 10:54 AM

## 2015-09-07 DIAGNOSIS — R21 Rash and other nonspecific skin eruption: Secondary | ICD-10-CM | POA: Diagnosis not present

## 2015-09-07 DIAGNOSIS — L03116 Cellulitis of left lower limb: Secondary | ICD-10-CM | POA: Diagnosis not present

## 2015-09-07 DIAGNOSIS — R7309 Other abnormal glucose: Secondary | ICD-10-CM | POA: Diagnosis not present

## 2015-09-07 DIAGNOSIS — I10 Essential (primary) hypertension: Secondary | ICD-10-CM | POA: Diagnosis not present

## 2015-09-07 LAB — CBC
HCT: 38.5 % (ref 36.0–46.0)
Hemoglobin: 12.4 g/dL (ref 12.0–15.0)
MCH: 27.5 pg (ref 26.0–34.0)
MCHC: 32.2 g/dL (ref 30.0–36.0)
MCV: 85.4 fL (ref 78.0–100.0)
PLATELETS: 267 10*3/uL (ref 150–400)
RBC: 4.51 MIL/uL (ref 3.87–5.11)
RDW: 13 % (ref 11.5–15.5)
WBC: 8 10*3/uL (ref 4.0–10.5)

## 2015-09-07 LAB — BASIC METABOLIC PANEL
Anion gap: 14 (ref 5–15)
BUN: 9 mg/dL (ref 6–20)
CALCIUM: 9 mg/dL (ref 8.9–10.3)
CO2: 26 mmol/L (ref 22–32)
CREATININE: 0.61 mg/dL (ref 0.44–1.00)
Chloride: 102 mmol/L (ref 101–111)
GLUCOSE: 150 mg/dL — AB (ref 65–99)
Potassium: 3.2 mmol/L — ABNORMAL LOW (ref 3.5–5.1)
Sodium: 142 mmol/L (ref 135–145)

## 2015-09-07 LAB — HEMOGLOBIN A1C
HEMOGLOBIN A1C: 6.7 % — AB (ref 4.8–5.6)
MEAN PLASMA GLUCOSE: 146 mg/dL

## 2015-09-07 LAB — TSH: TSH: 2.329 u[IU]/mL (ref 0.350–4.500)

## 2015-09-07 MED ORDER — POTASSIUM CHLORIDE CRYS ER 20 MEQ PO TBCR: 40.0000 meq | EXTENDED_RELEASE_TABLET | Freq: Every day | ORAL | Status: DC

## 2015-09-07 MED ORDER — POTASSIUM CHLORIDE CRYS ER 20 MEQ PO TBCR
40.0000 meq | EXTENDED_RELEASE_TABLET | Freq: Once | ORAL | Status: AC
  Administered 2015-09-07: 40 meq via ORAL
  Filled 2015-09-07: qty 2

## 2015-09-07 MED ORDER — TRIAMCINOLONE ACETONIDE 0.5 % EX CREA: TOPICAL_CREAM | Freq: Two times a day (BID) | CUTANEOUS | Status: DC

## 2015-09-07 MED ORDER — DOXYCYCLINE HYCLATE 100 MG PO TABS: 100.0000 mg | ORAL_TABLET | Freq: Two times a day (BID) | ORAL | Status: DC

## 2015-09-07 MED ORDER — TERBINAFINE HCL 1 % EX CREA: TOPICAL_CREAM | Freq: Two times a day (BID) | CUTANEOUS | Status: DC

## 2015-09-07 MED ORDER — POTASSIUM CHLORIDE CRYS ER 20 MEQ PO TBCR
40.0000 meq | EXTENDED_RELEASE_TABLET | Freq: Two times a day (BID) | ORAL | Status: DC
  Administered 2015-09-07: 40 meq via ORAL
  Filled 2015-09-07: qty 2

## 2015-09-07 MED ORDER — TRIAMCINOLONE ACETONIDE 0.5 % EX CREA
TOPICAL_CREAM | Freq: Two times a day (BID) | CUTANEOUS | Status: DC
  Filled 2015-09-07: qty 15

## 2015-09-07 NOTE — Progress Notes (Signed)
Family Medicine Teaching Service Daily Progress Note Intern Pager: [phone]  Patient name: Annette Cabrera Medical record number: [id number] Date of birth: September 14, 1960 Age: 55 y.o. Gender: female  Primary Care Provider: Chrisandra Netters, MD Consultants: None  Code Status: FULL   Pt Overview and Major Events to Date:  3/19: Patient admitted for cellulitis   Assessment and Plan: Annette Cabrera is a 55 y.o. female presenting with cellulitis after recent treatment with doxycyline and a diffuse rash. PMH is significant for HTN and Meniere's Disease.   Cellulitis: Approximately 1 month time course of cellulitis with completed course of Doxycyline 2 weeks ago. Patient is afebrile and without leukocytosis. Low suspicion for systemic infection given these factors and qSOFA score of 0. Lactic acidosis at admission since resolved.  Would consider this a mild case. Do not thinks she is refractory to Doxy. Skin changes seem more consistent with post inflammatory changes but will treat empirically with extended course of doxy since she is allergic to PCNs.Patient s/p Vancomycin x2. Could consider Clinda but risk of C Diff seems to outweigh benefits at this time.  -admit to MedSurg, vital signs per unit   - NS at 125 cc/hr  -Doxycyline (3/20 >>), s/p Vancomycin (3/19)  -PT consult>> recommended outpatient PT   -compression stockings for edema, keep feet elevated    Rash: Patient without history of exposure to wooded areas and rash not involving palms or soles; therefore low suspicion for tick-borne rash. Platelets stable at 292, without bleeding, and no mucosal involvement so doubt ITP or DIC as etiology. Does not appear consistent in appearance with Stevens-Johnson syndrome. Could be drug reaction rash to doxycyline, however less likely given no exposure to this medication for about 2 weeks. Suspect this is a benign rash related to exposure of some type of allergen during recent vacation to  [place].Could also be fungal in nature given presentation on intertriginous surfaces.  -will continue to monitor  -Benadryl 50 mg q6h for pruritis  -Lamisil cr ordered BID to AA -Consider adding oral prednisone/ topical steroid  HTN: Stable.  -continue home amlodipine 10 mg daily, HCTZ 25 mg daily, and lisinopril 20 mg daily  -continue to monitor -may need to increase home medication doses during hospital course for better BP control   Hypokalemia: K 3.2 at admission. Has a history of this and is on Potassium outpatient. K 3.2 (3/20)  -On an ACE-I so home K held for now. Replete PRN  Elevated Glucose: Glucose elevated to 144 on BMET. Given this elevation and patient's obesity, would like to evaluate further for DM. Underlying DM could also affect wound healing.  -HgB A1C ordered   Meniere's Disease: No complaints at admission.  -low salt diet  -thiazide as above  FEN/GI: NS at 125 cc/hr, Heart Healthy Diet  Prophylaxis: Lovenox sub-q  Disposition: Home   Subjective:  Still feeling itchy this morning. Otherwise, no complaints.   Objective: Temp:  [97.7 F (36.5 C)-98.3 F (36.8 C)] 98 F (36.7 C) (03/20 0551) Pulse Rate:  [80-92] 89 (03/20 0551) Resp:  [18-20] 18 (03/20 0551) BP: (112-145)/(45-79) 112/45 mmHg (03/20 0551) SpO2:  [98 %-99 %] 98 % (03/20 0551) Weight:  [240 lb 1.3 oz (108.9 kg)-274 lb 6.4 oz (124.467 kg)] 274 lb 6.4 oz (124.467 kg) (03/19 1057) Physical Exam: General: obese female lying in bed in NAD, non-toxic  Cardiovascular: RRR. No murmurs. 2+ Respiratory: CTAB. Normal WOB.  Abdomen: soft, NTND  Extremities: Erythematous maculopapular rash to bilateral lower  extremities (most prominent on inner thighs and superolateral hips), upper extremities (prominent of antecubital fossas), and on lower back/upper buttocks. Rash is blanchable, non Tender. No palmar or sole involvement. Please see pictures below for cellulitis. Increased warmth to  area of cellulitis, non-tender to palpation, non exudative, non bleeding. Erythema receding from marked borders.       Laboratory:  Recent Labs Lab 09/06/15 0700 09/07/15 0312  WBC 7.7 8.0  HGB 13.1 12.4  HCT 40.9 38.5  PLT 292 267    Recent Labs Lab 09/06/15 0700 09/07/15 0312  NA 143 142  K 3.2* 3.2*  CL 102 102  CO2 26 26  BUN 7 9  CREATININE 0.58 0.61  CALCIUM 9.1 9.0  GLUCOSE 144* 150*    Imaging/Diagnostic Tests: Dg Tibia/fibula Left  09/06/2015  CLINICAL DATA:  Pain following fall EXAM: LEFT TIBIA AND FIBULA - 2 VIEW COMPARISON:  Left ankle July 31, 2015 FINDINGS: Frontal and lateral views were obtained. A small calcification just medial to the medial malleolus is stable and may represent residua of prior avulsion. There is no demonstrable acute fracture or dislocation. No erosive change or bony destruction. Joint spaces appear normal. No abnormal periosteal reaction. No soft tissue air or calcification noted. IMPRESSION: Question prior avulsion along the medial malleolus medially. No acute fracture or dislocation. No bony destruction or soft tissue air. No abnormal periosteal reaction. Electronically Signed   By: Lowella Grip III M.D.   On: 09/06/2015 07:25    Nicolette Bang, DO 09/07/2015, 8:41 AM PGY-1, [place] Intern pager: [phone], text pages welcome

## 2015-09-07 NOTE — Progress Notes (Signed)
Pt ready for discharge. Education/instructions reviewed with pt and all questions/concerns addressed. IV removed and belongings gathered. Pt will be transported out via wheelchair. Will continue to monitor 

## 2015-09-07 NOTE — Discharge Instructions (Signed)
Take the Doxycycline through 3/25 to complete your antibiotic course. Keep your feet elevated and wear compression stockings.

## 2015-09-07 NOTE — Discharge Summary (Signed)
[facility] Discharge Summary  Patient name: Annette Cabrera Medical record number: [id number] Date of birth: September 02, 1960 Age: 55 y.o. Gender: female Date of Admission: 09/06/2015  Date of Discharge: 09/07/2015 Admitting Physician: Lind Covert, MD  Primary Care Provider: Chrisandra Netters, MD Consultants: None   Indication for Hospitalization: Cellulitis   Discharge Diagnoses/Problem List:  Patient Active Problem List   Diagnosis Date Noted  . Cellulitis 09/06/2015  . Cellulitis of leg without foot, left   . Rash   . Essential hypertension, benign   . Elevated glucose   . Meniere's disease 09/16/2013  . Allergic rhinitis 05/30/2013  . Hypertension 04/24/2013    Disposition: Home   Discharge Condition: Improved   Discharge Exam:  General: obese female lying in bed in NAD, non-toxic  Cardiovascular: RRR. No murmurs. 2+ Respiratory: CTAB. Normal WOB.  Abdomen: soft, NTND  Extremities: Erythematous maculopapular rash to bilateral lower extremities (most prominent on inner thighs and superolateral hips), upper extremities (prominent of antecubital fossas), and on lower back/upper buttocks. Rash is blanchable, non Tender. No palmar or sole involvement. Please see pictures below for cellulitis. Increased warmth to area of cellulitis, non-tender to palpation, non exudative, non bleeding. Erythema receding from marked borders.      Brief Hospital Course:  Annette Cabrera is 55 y.o. female with PMH of HTN and Meniere's Disease who presented with possible recurrent cellulitis of her left anterior shin. Patient suffered a fall last month and subsequently developed cellulitis to the area of injury. She was treated with a course of Doxycycline which she completed two weeks prior to admission.   When she presented to the ED, there was concern of worsening cellulitis so she was started on Vancomycin by the ED provider. At time of admission she was  afebrile and without a leukocytosis. She remained without any signs/symptoms of a systemic infection throughout her hospital course.   Unsure if patient had an actual bacterial infection vs. Increasing spread of erythema due to dependent edema. As she did not have systemic symptoms, she was switched to PO antibiotics after 1 day of IV Vancomycin.   Investigated causes of her peripheral edema. TSH was ordered and was negative. Her kidney function was WNL. From review of her previous labs outpatient, liver function and protein/albumin have been normal. Patient does not have a cardiac history or other signs/symptoms concerning for CHF.   Additionally, patient presented with new maculopapular rash. Etiologies such as tick borne rash, ITP/DIC, and Stevens-Johnson were ruled out. Considered drug reaction however time course of completed doxycycline made this less likely. Suspected benign rash. Either fungal in cause due to recent antibiotic treatment and location on flexural intertriginous surfaces or a contact dermatitis from recent travel to [place].   Issues for Follow Up:  1. Given LE edema, could consider decreasing amlodipine dose to see if improves edema.  2. Compression stockings brought to hospital room. Could consider Rx for additional pairs.  3. Patient with hypokalemia throughout hospitalization requiring repletion. On Ace-I and 20 mEq of potassium replacement at home. Would recheck K level at f/u and consider increase of daily supplementation if still hypokalemic.  4. PT evaluated patient in hospital and recommended outpatient PT f/u for possible ankle sprain with initial injury last month.  5. Due to elevated glucose on BMET, HgB A1C obtained. Result was 6.7. Please f/u with patient and discuss interventions/treatment options.  6. Monitor for improvement of rash. Discharged with benadryl, Lamisil, and Triamcinolone cream.  Significant Procedures: None   Significant Labs and Imaging:    Recent Labs Lab 09/06/15 0700 09/07/15 0312  WBC 7.7 8.0  HGB 13.1 12.4  HCT 40.9 38.5  PLT 292 267    Recent Labs Lab 09/06/15 0700 09/07/15 0312  NA 143 142  K 3.2* 3.2*  CL 102 102  CO2 26 26  GLUCOSE 144* 150*  BUN 7 9  CREATININE 0.58 0.61  CALCIUM 9.1 9.0     Results/Tests Pending at Time of Discharge: Blood Cultures Final Result Pending   Discharge Medications:    Medication List    TAKE these medications        amLODipine 10 MG tablet  Commonly known as:  NORVASC  TAKE 1 TABLET BY MOUTH EVERY DAY     diphenhydrAMINE 25 MG tablet  Commonly known as:  BENADRYL  Take 50 mg by mouth every 6 (six) hours as needed for allergies.     doxycycline 100 MG tablet  Commonly known as:  VIBRA-TABS  Take 1 tablet (100 mg total) by mouth every 12 (twelve) hours.     fexofenadine 180 MG tablet  Commonly known as:  ALLEGRA  Take 180 mg by mouth daily.     hydrochlorothiazide 25 MG tablet  Commonly known as:  HYDRODIURIL  TAKE 1 TABLET BY MOUTH EVERY DAY     lisinopril 20 MG tablet  Commonly known as:  PRINIVIL,ZESTRIL  TAKE 1 TABLET BY MOUTH EVERY DAY     potassium chloride SA 20 MEQ tablet  Commonly known as:  KLOR-CON M20  Take 2 tablets (40 mEq total) by mouth daily.     terbinafine 1 % cream  Commonly known as:  LAMISIL  Apply topically 2 (two) times daily.     triamcinolone cream 0.5 %  Commonly known as:  KENALOG  Apply topically 2 (two) times daily.        Discharge Instructions: Please refer to Patient Instructions section of EMR for full details.  Patient was counseled important signs and symptoms that should prompt return to medical care, changes in medications, dietary instructions, activity restrictions, and follow up appointments.   Follow-Up Appointments: Follow-up Information    Follow up with Chrisandra Netters, MD. Go on 09/11/2015.   Specialty:  Family Medicine   Why:  For Hospital Followup at [facility] information:   [place] [phone]       Nicolette Bang, DO 09/08/2015, 12:24 PM PGY-1, [place]

## 2015-09-09 NOTE — Telephone Encounter (Signed)
2nd request.  Hanz Winterhalter L, RN  

## 2015-09-10 ENCOUNTER — Inpatient Hospital Stay: Payer: Managed Care, Other (non HMO) | Admitting: Family Medicine

## 2015-09-11 ENCOUNTER — Encounter: Payer: Self-pay | Admitting: Family Medicine

## 2015-09-11 ENCOUNTER — Ambulatory Visit (INDEPENDENT_AMBULATORY_CARE_PROVIDER_SITE_OTHER): Payer: Managed Care, Other (non HMO) | Admitting: Family Medicine

## 2015-09-11 VITALS — BP 154/82 | HR 99 | Temp 98.3°F | Ht 63.0 in | Wt 267.0 lb

## 2015-09-11 DIAGNOSIS — L03116 Cellulitis of left lower limb: Secondary | ICD-10-CM

## 2015-09-11 DIAGNOSIS — E119 Type 2 diabetes mellitus without complications: Secondary | ICD-10-CM

## 2015-09-11 DIAGNOSIS — I10 Essential (primary) hypertension: Secondary | ICD-10-CM

## 2015-09-11 DIAGNOSIS — R21 Rash and other nonspecific skin eruption: Secondary | ICD-10-CM | POA: Diagnosis not present

## 2015-09-11 LAB — CULTURE, BLOOD (ROUTINE X 2)
Culture: NO GROWTH
Culture: NO GROWTH

## 2015-09-11 MED ORDER — LISINOPRIL 20 MG PO TABS: ORAL_TABLET | ORAL | Status: DC

## 2015-09-11 MED ORDER — HYDROCHLOROTHIAZIDE 25 MG PO TABS: 25.0000 mg | ORAL_TABLET | Freq: Every day | ORAL | Status: DC

## 2015-09-11 MED ORDER — AMLODIPINE BESYLATE 10 MG PO TABS: ORAL_TABLET | ORAL | Status: DC

## 2015-09-11 MED ORDER — DOXYCYCLINE HYCLATE 100 MG PO TABS: 100.0000 mg | ORAL_TABLET | Freq: Two times a day (BID) | ORAL | Status: DC

## 2015-09-11 MED ORDER — PREDNISONE 50 MG PO TABS: 50.0000 mg | ORAL_TABLET | Freq: Every day | ORAL | Status: DC

## 2015-09-11 NOTE — Progress Notes (Signed)
  HPI:  Annette Cabrera presents for hospital follow up. Patient was hospitalized from 3/19 to 3/20 with possible cellulitis and a macular rash on body.  Pt now reports she's been doing well since discharged. Was dc'd on doxycycline for cellulitis.  Cellulitis - a few days of doxy left. Thinks the redness has spread a little. No fevers. Swelling has improved with wearing compression hose on left leg  Rash - this is still present. Started on arms, which has resolved. Now primarily on legs. Very pruritic. Has been using topical steroid, not topical antifungal. No sores in mouth or genitals.  Diabetes - had A1c done in hospital and it was 6.7. Patient was unaware of this result and its meaning for her, that this is a new dx of diabetes. She is upset at this news.  ROS: See HPI.  [place]: hypertension, allergic rhinitis, menieres disease, recent dx of type 2 diabetes   PHYSICAL EXAM: BP 154/82 mmHg  Pulse 99  Temp(Src) 98.3 F (36.8 C) (Oral)  Ht 5\' 3"  (1.6 m)  Wt 267 lb (121.11 kg)  BMI 47.31 kg/m2  LMP 09/06/2011 Gen: NAD, pleasant, cooperative HEENT: normocephalic, atraumatic, moist mucous membranes no oral lesions Heart: regular rate and rhythm no murmur Lungs: clear to auscultation bilaterally normal work of breathing  Neuro: alert, grossly nonfocal, speech normal Ext: left leg with erythema on lower shin (see photo below) Skin: thighs with erythematous macular rash (see photo below)  Left leg:   R inner thigh rash:     ASSESSMENT/PLAN:  Cellulitis of leg without foot, left Redness extended slightly. See photos. Extend doxycycline for another 5 days. Follow up early next week to evaluate for improvement.   Diabetes type 2, controlled ([place]) A1c 6.7 in hospital. New dx of diabetes for patient. She was tearful at this news today. Is interested in the idea of seeing nutritionist Encouraged her to channel her disappointment into motivation for weight loss and lifestyle  changes. Hold on starting medication today. Will discuss at follow up.   Hypertension Refill blood pressure medications today. Has been out due to chronically poor follow up. Emphasized importance of regular follow up in order to monitor for progression of disease. Will recheck at follow up visit early next week Needs BMET then to evaluate potassium.  Rash Somewhat improved per patient, but intensely pruritic No obvious identifiable cause Will rx prednisone to see if patient gets relief with this Follow up early next week to evaluate for improvement   FOLLOW UP: Follow up in 3-4 days for above issues At that visit needs BMET, discuss physical therapy referral  [place] J. Ardelia Mems, [place]

## 2015-09-11 NOTE — Patient Instructions (Signed)
Take prednisone 50mg  daily for 5 days Sent in more doxycycline for you  Follow up with me Monday or Tuesday of next week. If worsening at all call us or go to ER.  Sent in refills of your blood pressure medication  Be well, Dr. Ardelia Mems  Type 2 Diabetes Mellitus, Adult Type 2 diabetes mellitus, often simply referred to as type 2 diabetes, is a long-lasting (chronic) disease. In type 2 diabetes, the pancreas does not make enough insulin (a hormone), the cells are less responsive to the insulin that is made (insulin resistance), or both. Normally, insulin moves sugars from food into the tissue cells. The tissue cells use the sugars for energy. The lack of insulin or the lack of normal response to insulin causes excess sugars to build up in the blood instead of going into the tissue cells. As a result, high blood sugar (hyperglycemia) develops. The effect of high sugar (glucose) levels can cause many complications. Type 2 diabetes was also previously called adult-onset diabetes, but it can occur at any age.  RISK FACTORS  A person is predisposed to developing type 2 diabetes if someone in the family has the disease and also has one or more of the following primary risk factors:  Weight gain, or being overweight or obese.  An inactive lifestyle.  A history of consistently eating high-calorie foods. Maintaining a normal weight and regular physical activity can reduce the chance of developing type 2 diabetes. SYMPTOMS  A person with type 2 diabetes may not show symptoms initially. The symptoms of type 2 diabetes appear slowly. The symptoms include:  Increased thirst (polydipsia).  Increased urination (polyuria).  Increased urination during the night (nocturia).  Sudden or unexplained weight changes.  Frequent, recurring infections.  Tiredness (fatigue).  Weakness.  Vision changes, such as blurred vision.  Fruity smell to your breath.  Abdominal pain.  Nausea or  vomiting.  Cuts or bruises which are slow to heal.  Tingling or numbness in the hands or feet.  An open skin wound (ulcer). DIAGNOSIS Type 2 diabetes is frequently not diagnosed until complications of diabetes are present. Type 2 diabetes is diagnosed when symptoms or complications are present and when blood glucose levels are increased. Your blood glucose level may be checked by one or more of the following blood tests:  A fasting blood glucose test. You will not be allowed to eat for at least 8 hours before a blood sample is taken.  A random blood glucose test. Your blood glucose is checked at any time of the day regardless of when you ate.  A hemoglobin A1c blood glucose test. A hemoglobin A1c test provides information about blood glucose control over the previous 3 months.  An oral glucose tolerance test (OGTT). Your blood glucose is measured after you have not eaten (fasted) for 2 hours and then after you drink a glucose-containing beverage. TREATMENT   You may need to take insulin or diabetes medicine daily to keep blood glucose levels in the desired range.  If you use insulin, you may need to adjust the dosage depending on the carbohydrates that you eat with each meal or snack.  Lifestyle changes are recommended as part of your treatment. These may include:  Following an individualized diet plan developed by a nutritionist or dietitian.  Exercising daily. Your health care providers will set individualized treatment goals for you based on your age, your medicines, how long you have had diabetes, and any other medical conditions you have. Generally, the  goal of treatment is to maintain the following blood glucose levels:  Before meals (preprandial): 80-130 mg/dL.  After meals (postprandial): below 180 mg/dL.  A1c: less than 6.5-7%. HOME CARE INSTRUCTIONS   Have your hemoglobin A1c level checked twice a year.  Perform daily blood glucose monitoring as directed by your  health care provider.  Monitor urine ketones when you are ill and as directed by your health care provider.  Take your diabetes medicine or insulin as directed by your health care provider to maintain your blood glucose levels in the desired range.  Never run out of diabetes medicine or insulin. It is needed every day.  If you are using insulin, you may need to adjust the amount of insulin given based on your intake of carbohydrates. Carbohydrates can raise blood glucose levels but need to be included in your diet. Carbohydrates provide vitamins, minerals, and fiber which are an essential part of a healthy diet. Carbohydrates are found in fruits, vegetables, whole grains, dairy products, legumes, and foods containing added sugars.  Eat healthy foods. You should make an appointment to see a registered dietitian to help you create an eating plan that is right for you.  Lose weight if you are overweight.  Carry a medical alert card or wear your medical alert jewelry.  Carry a 15-gram carbohydrate snack with you at all times to treat low blood glucose (hypoglycemia). Some examples of 15-gram carbohydrate snacks include:  Glucose tablets, 3 or 4.  Glucose gel, 15-gram tube.  Raisins, 2 tablespoons (24 grams).  Jelly beans, 6.  Animal crackers, 8.  Regular pop, 4 ounces (120 mL).  Gummy treats, 9.  Recognize hypoglycemia. Hypoglycemia occurs with blood glucose levels of 70 mg/dL and below. The risk for hypoglycemia increases when fasting or skipping meals, during or after intense exercise, and during sleep. Hypoglycemia symptoms can include:  Tremors or shakes.  Decreased ability to concentrate.  Sweating.  Increased heart rate.  Headache.  Dry mouth.  Hunger.  Irritability.  Anxiety.  Restless sleep.  Altered speech or coordination.  Confusion.  Treat hypoglycemia promptly. If you are alert and able to safely swallow, follow the 15:15 rule:  Take 15-20 grams of  rapid-acting glucose or carbohydrate. Rapid-acting options include glucose gel, glucose tablets, or 4 ounces (120 mL) of fruit juice, regular soda, or low-fat milk.  Check your blood glucose level 15 minutes after taking the glucose.  Take 15-20 grams more of glucose if the repeat blood glucose level is still 70 mg/dL or below.  Eat a meal or snack within 1 hour once blood glucose levels return to normal.  Be alert to feeling very thirsty and urinating more frequently than usual, which are early signs of hyperglycemia. An early awareness of hyperglycemia allows for prompt treatment. Treat hyperglycemia as directed by your health care provider.  Engage in at least 150 minutes of moderate-intensity physical activity a week, spread over at least 3 days of the week or as directed by your health care provider. In addition, you should engage in resistance exercise at least 2 times a week or as directed by your health care provider. Try to spend no more than 90 minutes at one time inactive.  Adjust your medicine and food intake as needed if you start a new exercise or sport.  Follow your sick-day plan anytime you are unable to eat or drink as usual.  Do not use any tobacco products including cigarettes, chewing tobacco, or electronic cigarettes. If you need help  quitting, ask your health care provider.  Limit alcohol intake to no more than 1 drink per day for nonpregnant women and 2 drinks per day for men. You should drink alcohol only when you are also eating food. Talk with your health care provider whether alcohol is safe for you. Tell your health care provider if you drink alcohol several times a week.  Keep all follow-up visits as directed by your health care provider. This is important.  Schedule an eye exam soon after the diagnosis of type 2 diabetes and then annually.  Perform daily skin and foot care. Examine your skin and feet daily for cuts, bruises, redness, nail problems, bleeding,  blisters, or sores. A foot exam by a health care provider should be done annually.  Brush your teeth and gums at least twice a day and floss at least once a day. Follow up with your dentist regularly.  Share your diabetes management plan with your workplace or school.  Keep your immunizations up to date. It is recommended that you receive a flu (influenza) vaccine every year. It is also recommended that you receive a pneumonia (pneumococcal) vaccine. If you are 62 years of age or older and have never received a pneumonia vaccine, this vaccine may be given as a series of two separate shots. Ask your health care provider which additional vaccines may be recommended.  Learn to manage stress.  Obtain ongoing diabetes education and support as needed.  Participate in or seek rehabilitation as needed to maintain or improve independence and quality of life. Request a physical or occupational therapy referral if you are having foot or hand numbness, or difficulties with grooming, dressing, eating, or physical activity. SEEK MEDICAL CARE IF:   You are unable to eat food or drink fluids for more than 6 hours.  You have nausea and vomiting for more than 6 hours.  Your blood glucose level is over 240 mg/dL.  There is a change in mental status.  You develop an additional serious illness.  You have diarrhea for more than 6 hours.  You have been sick or have had a fever for a couple of days and are not getting better.  You have pain during any physical activity.  SEEK IMMEDIATE MEDICAL CARE IF:  You have difficulty breathing.  You have moderate to large ketone levels.   This information is not intended to replace advice given to you by your health care provider. Make sure you discuss any questions you have with your health care provider.   Document Released: 06/06/2005 Document Revised: 02/25/2015 Document Reviewed: 01/03/2012 Elsevier Interactive Patient Education [employer].

## 2015-09-13 DIAGNOSIS — E119 Type 2 diabetes mellitus without complications: Secondary | ICD-10-CM | POA: Insufficient documentation

## 2015-09-13 NOTE — Assessment & Plan Note (Signed)
Somewhat improved per patient, but intensely pruritic No obvious identifiable cause Will rx prednisone to see if patient gets relief with this Follow up early next week to evaluate for improvement

## 2015-09-13 NOTE — Assessment & Plan Note (Signed)
A1c 6.7 in hospital. New dx of diabetes for patient. She was tearful at this news today. Is interested in the idea of seeing nutritionist Encouraged her to channel her disappointment into motivation for weight loss and lifestyle changes. Hold on starting medication today. Will discuss at follow up.

## 2015-09-13 NOTE — Assessment & Plan Note (Signed)
Refill blood pressure medications today. Has been out due to chronically poor follow up. Emphasized importance of regular follow up in order to monitor for progression of disease. Will recheck at follow up visit early next week Needs BMET then to evaluate potassium.

## 2015-09-13 NOTE — Assessment & Plan Note (Signed)
Redness extended slightly. See photos. Extend doxycycline for another 5 days. Follow up early next week to evaluate for improvement.

## 2015-09-15 ENCOUNTER — Encounter: Payer: Self-pay | Admitting: Family Medicine

## 2015-09-15 ENCOUNTER — Ambulatory Visit (INDEPENDENT_AMBULATORY_CARE_PROVIDER_SITE_OTHER): Payer: Managed Care, Other (non HMO) | Admitting: Family Medicine

## 2015-09-15 VITALS — BP 130/87 | HR 102 | Temp 98.1°F | Ht 63.0 in | Wt 268.1 lb

## 2015-09-15 DIAGNOSIS — I10 Essential (primary) hypertension: Secondary | ICD-10-CM | POA: Diagnosis not present

## 2015-09-15 DIAGNOSIS — R21 Rash and other nonspecific skin eruption: Secondary | ICD-10-CM

## 2015-09-15 DIAGNOSIS — L03116 Cellulitis of left lower limb: Secondary | ICD-10-CM

## 2015-09-15 DIAGNOSIS — E119 Type 2 diabetes mellitus without complications: Secondary | ICD-10-CM | POA: Diagnosis not present

## 2015-09-15 NOTE — Patient Instructions (Addendum)
Checking kidneys & potassium today  For weight management: Call Dr. Jenne Campus (our nutritionist) to set up an appointment. Her phone number is: [phone].  See me in 3 weeks  Be well, Dr. Ardelia Mems     Diabetes Mellitus and Food It is important for you to manage your blood sugar (glucose) level. Your blood glucose level can be greatly affected by what you eat. Eating healthier foods in the appropriate amounts throughout the day at about the same time each day will help you control your blood glucose level. It can also help slow or prevent worsening of your diabetes mellitus. Healthy eating may even help you improve the level of your blood pressure and reach or maintain a healthy weight.  General recommendations for healthful eating and cooking habits include:  Eating meals and snacks regularly. Avoid going long periods of time without eating to lose weight.  Eating a diet that consists mainly of plant-based foods, such as fruits, vegetables, nuts, legumes, and whole grains.  Using low-heat cooking methods, such as baking, instead of high-heat cooking methods, such as deep frying. Work with your dietitian to make sure you understand how to use the Nutrition Facts information on food labels. HOW CAN FOOD AFFECT ME? Carbohydrates Carbohydrates affect your blood glucose level more than any other type of food. Your dietitian will help you determine how many carbohydrates to eat at each meal and teach you how to count carbohydrates. Counting carbohydrates is important to keep your blood glucose at a healthy level, especially if you are using insulin or taking certain medicines for diabetes mellitus. Alcohol Alcohol can cause sudden decreases in blood glucose (hypoglycemia), especially if you use insulin or take certain medicines for diabetes mellitus. Hypoglycemia can be a life-threatening condition. Symptoms of hypoglycemia (sleepiness, dizziness, and disorientation) are similar to symptoms of having  too much alcohol.  If your health care provider has given you approval to drink alcohol, do so in moderation and use the following guidelines:  Women should not have more than one drink per day, and men should not have more than two drinks per day. One drink is equal to:  12 oz of beer.  5 oz of wine.  1 oz of hard liquor.  Do not drink on an empty stomach.  Keep yourself hydrated. Have water, diet soda, or unsweetened iced tea.  Regular soda, juice, and other mixers might contain a lot of carbohydrates and should be counted. WHAT FOODS ARE NOT RECOMMENDED? As you make food choices, it is important to remember that all foods are not the same. Some foods have fewer nutrients per serving than other foods, even though they might have the same number of calories or carbohydrates. It is difficult to get your body what it needs when you eat foods with fewer nutrients. Examples of foods that you should avoid that are high in calories and carbohydrates but low in nutrients include:  Trans fats (most processed foods list trans fats on the Nutrition Facts label).  Regular soda.  Juice.  Candy.  Sweets, such as cake, pie, doughnuts, and cookies.  Fried foods. WHAT FOODS CAN I EAT? Eat nutrient-rich foods, which will nourish your body and keep you healthy. The food you should eat also will depend on several factors, including:  The calories you need.  The medicines you take.  Your weight.  Your blood glucose level.  Your blood pressure level.  Your cholesterol level. You should eat a variety of foods, including:  Protein.  [employer]  cuts of meat.  Proteins low in saturated fats, such as fish, egg whites, and beans. Avoid processed meats.  Fruits and vegetables.  Fruits and vegetables that may help control blood glucose levels, such as apples, mangoes, and yams.  Dairy products.  Choose fat-free or low-fat dairy products, such as milk, yogurt, and cheese.  Grains, bread,  pasta, and rice.  Choose whole grain products, such as multigrain bread, whole oats, and brown rice. These foods may help control blood pressure.  Fats.  Foods containing healthful fats, such as nuts, avocado, olive oil, canola oil, and fish. DOES EVERYONE WITH DIABETES MELLITUS HAVE THE SAME MEAL PLAN? Because every person with diabetes mellitus is different, there is not one meal plan that works for everyone. It is very important that you meet with a dietitian who will help you create a meal plan that is just right for you.   This information is not intended to replace advice given to you by your health care provider. Make sure you discuss any questions you have with your health care provider.   Document Released: 03/03/2005 Document Revised: 06/27/2014 Document Reviewed: 05/03/2013 Elsevier Interactive Patient Education 2016 [place] Carbohydrate Counting for Diabetes Mellitus Carbohydrate counting is a method for keeping track of the amount of carbohydrates you eat. Eating carbohydrates naturally increases the level of sugar (glucose) in your blood, so it is important for you to know the amount that is okay for you to have in every meal. Carbohydrate counting helps keep the level of glucose in your blood within normal limits. The amount of carbohydrates allowed is different for every person. A dietitian can help you calculate the amount that is right for you. Once you know the amount of carbohydrates you can have, you can count the carbohydrates in the foods you want to eat. Carbohydrates are found in the following foods:  Grains, such as breads and cereals.  Dried beans and soy products.  Starchy vegetables, such as potatoes, peas, and corn.  Fruit and fruit juices.  Milk and yogurt.  Sweets and snack foods, such as cake, cookies, candy, chips, soft drinks, and fruit drinks. CARBOHYDRATE COUNTING There are two ways to count the carbohydrates in your food. You can  use either of the methods or a combination of both. Reading the "Nutrition Facts" on [place] The "Nutrition Facts" is an area that is included on the labels of almost all packaged food and beverages in the [place]. It includes the serving size of that food or beverage and information about the nutrients in each serving of the food, including the grams (g) of carbohydrate per serving.  Decide the number of servings of this food or beverage that you will be able to eat or drink. Multiply that number of servings by the number of grams of carbohydrate that is listed on the label for that serving. The total will be the amount of carbohydrates you will be having when you eat or drink this food or beverage. Learning Standard Serving Sizes of Food When you eat food that is not packaged or does not include "Nutrition Facts" on the label, you need to measure the servings in order to count the amount of carbohydrates.A serving of most carbohydrate-rich foods contains about 15 g of carbohydrates. The following list includes serving sizes of carbohydrate-rich foods that provide 15 g ofcarbohydrate per serving:   1 slice of bread (1 oz) or 1 six-inch tortilla.    of a hamburger bun  or English muffin.  4-6 crackers.   cup unsweetened dry cereal.    cup hot cereal.   cup rice or pasta.    cup mashed potatoes or  of a large baked potato.  1 cup fresh fruit or one small piece of fruit.    cup canned or frozen fruit or fruit juice.  1 cup milk.   cup plain fat-free yogurt or yogurt sweetened with artificial sweeteners.   cup cooked dried beans or starchy vegetable, such as peas, corn, or potatoes.  Decide the number of standard-size servings that you will eat. Multiply that number of servings by 15 (the grams of carbohydrates in that serving). For example, if you eat 2 cups of strawberries, you will have eaten 2 servings and 30 g of carbohydrates (2 servings x 15 g = 30 g). For  foods such as soups and casseroles, in which more than one food is mixed in, you will need to count the carbohydrates in each food that is included. EXAMPLE OF CARBOHYDRATE COUNTING Sample Dinner  3 oz chicken breast.   cup of brown rice.   cup of corn.  1 cup milk.   1 cup strawberries with sugar-free whipped topping.  Carbohydrate Calculation Step 1: Identify the foods that contain carbohydrates:   Rice.   Corn.   Milk.   Strawberries. Step 2:Calculate the number of servings eaten of each:   2 servings of rice.   1 serving of corn.   1 serving of milk.   1 serving of strawberries. Step 3: Multiply each of those number of servings by 15 g:   2 servings of rice x 15 g = 30 g.   1 serving of corn x 15 g = 15 g.   1 serving of milk x 15 g = 15 g.   1 serving of strawberries x 15 g = 15 g. Step 4: Add together all of the amounts to find the total grams of carbohydrates eaten: 30 g + 15 g + 15 g + 15 g = 75 g.   This information is not intended to replace advice given to you by your health care provider. Make sure you discuss any questions you have with your health care provider.   Document Released: 06/06/2005 Document Revised: 06/27/2014 Document Reviewed: 05/03/2013 Elsevier Interactive Patient Education [employer].

## 2015-09-16 ENCOUNTER — Encounter: Payer: Self-pay | Admitting: Family Medicine

## 2015-09-16 LAB — BASIC METABOLIC PANEL
BUN: 18 mg/dL (ref 7–25)
CALCIUM: 9.7 mg/dL (ref 8.6–10.4)
CO2: 20 mmol/L (ref 20–31)
CREATININE: 0.91 mg/dL (ref 0.50–1.05)
Chloride: 100 mmol/L (ref 98–110)
Glucose, Bld: 185 mg/dL — ABNORMAL HIGH (ref 65–99)
Potassium: 4.3 mmol/L (ref 3.5–5.3)
Sodium: 137 mmol/L (ref 135–146)

## 2015-09-16 NOTE — Assessment & Plan Note (Signed)
Motivated to make lifestyle changes. Referral to nutritionist & gave Dr. Jenne Campus phone number. Follow up in 3 weeks to evaluate for progress.

## 2015-09-16 NOTE — Assessment & Plan Note (Addendum)
Improved. Finish out doxycycline & prednisone. Follow up if not improving, otherwise I'll see her in 3 weeks. I don't see any evidence of DVT on exam today, but will defer choice of doppler to workers comp MD Will complete FMLA paperwork & short term disability papers and fax in per patient request (she brought papers today) She would also like a copy made & available for her to pick up for her records.

## 2015-09-16 NOTE — Assessment & Plan Note (Signed)
Improved on prednisone. Finish course. Follow up if worsens

## 2015-09-16 NOTE — Assessment & Plan Note (Signed)
Well controlled. Continue current regimen. Check BMET today to evaluate potassium & renal function.

## 2015-09-16 NOTE — Progress Notes (Signed)
Date of Visit: 09/15/2015   HPI:  Patient presents for follow up.  Cellulitis - thinks leg is doing better. Still on doxycycline. No fevers. This is the best it's looked and felt since her fall. Has seen workers comp physician at [employer] and they are apparently planning on ultrasounding her leg to rule out dvt.  Rash - improving on prednisone. Still present on legs but overall much better.   Work paperwork - needs FMLA and short term disability forms filled out. Fell on the 10th, went to ED that day. Admitted to hospital on 19th, and began missing work starting 3/20. Planning to go back tomorrow 3/29.  Diabetes - news has sunk in more about this diagnosis. Has already been working on eating healthier. Wants to see Dr. Jenne Campus here at [facility] for nutritional counseling.  Hypertension - took blood pressure medications last night (lisinopril, amlodipine, hctz).  ROS: See HPI.  [place]: history of recently diagnosed diabetes, hypertension, meniere's disease  PHYSICAL EXAM: BP 130/87 mmHg  Pulse 102  Temp(Src) 98.1 F (36.7 C) (Oral)  Ht 5\' 3"  (1.6 m)  Wt 268 lb 1.6 oz (121.609 kg)  BMI 47.50 kg/m2  SpO2 94%  LMP 09/06/2011 Gen: NAD, pleasant, cooperative HEENT: ncat Heart:  Regular rate and rhythm, no murmur Lungs: clear to auscultation bilaterally, normal work of breathing  Neuro: alert, grossly nonfocal, speech normal Ext: improved erythema over left shin. No warmth, erythema, or tenderness. No calf tenderness. Swelling greatly improved. See photo below Skin: macular erythematous rash improved on legs, still present but overall less     ASSESSMENT/PLAN:  Hypertension Well controlled. Continue current regimen. Check BMET today to evaluate potassium & renal function.   Diabetes type 2, controlled ([place]) Motivated to make lifestyle changes. Referral to nutritionist & gave Dr. Jenne Campus phone number. Follow up in 3 weeks to evaluate for progress.   Cellulitis of leg  without foot, left Improved. Finish out doxycycline & prednisone. Follow up if not improving, otherwise I'll see her in 3 weeks. I don't see any evidence of DVT on exam today, but will defer choice of doppler to workers comp MD Will complete FMLA paperwork & short term disability papers and fax in per patient request (she brought papers today) She would also like a copy made & available for her to pick up for her records.  Rash Improved on prednisone. Finish course. Follow up if worsens   FOLLOW UP: Follow up in 3 weeks for weight management Referral to nutritionist  [place] J. Ardelia Mems, [place]

## 2015-09-18 ENCOUNTER — Telehealth: Payer: Self-pay | Admitting: Family Medicine

## 2015-09-18 NOTE — Telephone Encounter (Signed)
FMLA & short term disability paperwork completed.  Returned to [employer] to make copies, fax, scan into chart, and notify patient she can pick up her copy.  Leeanne Rio, MD

## 2015-09-21 NOTE — Telephone Encounter (Signed)
Patient informed that FMLA forms were faxed to [facility].  Forms copied for scanning in patient's record.  Original forms placed up front for pick up.  Derl Barrow, RN

## 2015-09-23 ENCOUNTER — Encounter: Payer: Self-pay | Admitting: *Deleted

## 2015-09-23 ENCOUNTER — Telehealth: Payer: Self-pay | Admitting: Family Medicine

## 2015-09-23 NOTE — Telephone Encounter (Signed)
Patient called an left message that her return to work letter was not in her packet.  She is needing it faxed to [phone].

## 2015-09-23 NOTE — Telephone Encounter (Signed)
Letter faxed to number provided. Left message for patient.

## 2015-10-08 ENCOUNTER — Encounter: Payer: Self-pay | Admitting: Family Medicine

## 2015-10-08 ENCOUNTER — Ambulatory Visit (INDEPENDENT_AMBULATORY_CARE_PROVIDER_SITE_OTHER): Payer: Managed Care, Other (non HMO) | Admitting: Family Medicine

## 2015-10-08 VITALS — BP 131/62 | HR 109 | Temp 98.4°F | Ht 63.0 in | Wt 267.6 lb

## 2015-10-08 DIAGNOSIS — I10 Essential (primary) hypertension: Secondary | ICD-10-CM

## 2015-10-08 DIAGNOSIS — R21 Rash and other nonspecific skin eruption: Secondary | ICD-10-CM

## 2015-10-08 DIAGNOSIS — E119 Type 2 diabetes mellitus without complications: Secondary | ICD-10-CM | POA: Diagnosis not present

## 2015-10-08 MED ORDER — HYDROCORTISONE 0.5 % EX CREA: 1.0000 "application " | TOPICAL_CREAM | Freq: Two times a day (BID) | CUTANEOUS | Status: DC | PRN

## 2015-10-08 MED ORDER — METFORMIN HCL ER 500 MG PO TB24: 500.0000 mg | ORAL_TABLET | Freq: Every day | ORAL | Status: DC

## 2015-10-08 NOTE — Patient Instructions (Addendum)
I will re-complete your FMLA paperwork  Sent in metformin 500mg  daily. Take with food. Might upset your stomach, if so, stick with it  Get your eye exam.  Use hydrocortisone cream for itching on your shin  Follow up with me in 3 months, sooner if needed  Be well, Dr. Ardelia Mems

## 2015-10-12 ENCOUNTER — Ambulatory Visit: Payer: Managed Care, Other (non HMO) | Admitting: Family Medicine

## 2015-10-12 NOTE — Assessment & Plan Note (Addendum)
Doing well with lifestyle changes. She's going to continue working on this. Start metformin XR 500mg  daily (prefers once daily dosing) Follow up with me in 3 months for next A1c, sooner if needed. Counseled her to schedule appointment with eye doctor. Will complete FMLA paperwork again & fax in per patient's request.

## 2015-10-12 NOTE — Progress Notes (Signed)
Date of Visit: 10/08/2015   HPI:  Annette Cabrera presents for follow up.  Needs new FMLA paperwork completed for her diabetes and other chronic medical problems. Wants it to be for 4 hours/month for visits, and 1 day/month for flareups. Her workers compensation claim was denied for the leg injury she sustained, since it occurred outside of her work [employer]. This is disappointing to her.  Diabetes - has been eating better. Willing to start metformin today. Weight down a couple pounds. Still adjusting to the idea of having diabetes. Has not contacted nutritionist as she's not sure her insurance will cover it. Planning to possibly see one at her job.  Rash and cellulitis are still improved. Has some itching of left shin over where the area of cellulitis was previously. Has been scratching.  ROS: See HPI.  [place]: history of type 2 diabetes, hypertension, menieres disease, allergic rhinitis, cellulitis of leg  PHYSICAL EXAM: BP 131/62 mmHg  Pulse 109  Temp(Src) 98.4 F (36.9 C) (Oral)  Ht 5\' 3"  (1.6 m)  Wt 267 lb 9.6 oz (121.383 kg)  BMI 47.42 kg/m2  LMP 09/06/2011 Gen: NAD, pleasant, cooperative HEENT: normocephalic, atraumatic, moist mucous membranes Lungs: normal work of breathing  Neuro: alert, grossly nonfocal, speech normal Ext: LLE with mild excoriations present over left anterior shin. No surrounding erythema or warmth  ASSESSMENT/PLAN:  Diabetes type 2, controlled ([place]) Doing well with lifestyle changes. She's going to continue working on this. Start metformin XR 500mg  daily (prefers once daily dosing) Follow up with me in 3 months for next A1c, sooner if needed. Counseled her to schedule appointment with eye doctor. Will complete FMLA paperwork again & fax in per patient's request.  Hypertension Well controlled. Continue current regimen.   Rash Improving. rx hydrocortisone for use over itching on L shin. Cellulitis has resolved.   FOLLOW UP: Follow up in 3 months for  next A1c, sooner if needed  [place] J. Ardelia Mems, [place]

## 2015-10-12 NOTE — Assessment & Plan Note (Signed)
Well-controlled.  Continue current regimen. 

## 2015-10-12 NOTE — Assessment & Plan Note (Signed)
Improving. rx hydrocortisone for use over itching on L shin. Cellulitis has resolved.

## 2015-10-20 ENCOUNTER — Telehealth: Payer: Self-pay | Admitting: Family Medicine

## 2015-10-20 NOTE — Telephone Encounter (Signed)
Patient informed that FMLA forms were completed and faxed to [facility] per request.  Forms were copied for scanning in patient's record.  Original forms placed up front for patient pickup.  Derl Barrow, RN

## 2015-10-20 NOTE — Telephone Encounter (Signed)
Pt is calling because the FMLA papers should have a different claim number on it. The claim number should be [id number]. jw

## 2015-10-20 NOTE — Telephone Encounter (Signed)
FMLA forms completed, wrote new claim # at the top of each page. Will return to [place] will need to be scanned into chart & faxed to number provided.  Please inform patient.   Leeanne Rio, MD

## 2015-10-22 ENCOUNTER — Ambulatory Visit (INDEPENDENT_AMBULATORY_CARE_PROVIDER_SITE_OTHER): Payer: Managed Care, Other (non HMO) | Admitting: Family Medicine

## 2015-10-22 ENCOUNTER — Encounter: Payer: Self-pay | Admitting: Family Medicine

## 2015-10-22 VITALS — BP 136/84 | HR 92 | Temp 98.7°F | Ht 63.0 in | Wt 263.5 lb

## 2015-10-22 DIAGNOSIS — R110 Nausea: Secondary | ICD-10-CM

## 2015-10-22 DIAGNOSIS — R350 Frequency of micturition: Secondary | ICD-10-CM

## 2015-10-22 DIAGNOSIS — M546 Pain in thoracic spine: Secondary | ICD-10-CM

## 2015-10-22 DIAGNOSIS — R197 Diarrhea, unspecified: Secondary | ICD-10-CM | POA: Diagnosis not present

## 2015-10-22 DIAGNOSIS — E119 Type 2 diabetes mellitus without complications: Secondary | ICD-10-CM | POA: Diagnosis not present

## 2015-10-22 LAB — POCT URINALYSIS DIPSTICK
Bilirubin, UA: NEGATIVE
Blood, UA: NEGATIVE
Glucose, UA: NEGATIVE
KETONES UA: NEGATIVE
LEUKOCYTES UA: NEGATIVE
Nitrite, UA: NEGATIVE
PH UA: 6.5
PROTEIN UA: NEGATIVE
SPEC GRAV UA: 1.02
UROBILINOGEN UA: 1

## 2015-10-22 MED ORDER — ONDANSETRON HCL 4 MG PO TABS: 4.0000 mg | ORAL_TABLET | Freq: Three times a day (TID) | ORAL | Status: DC | PRN

## 2015-10-22 MED ORDER — CEPHALEXIN 500 MG PO CAPS: 500.0000 mg | ORAL_CAPSULE | Freq: Three times a day (TID) | ORAL | Status: DC

## 2015-10-22 NOTE — Patient Instructions (Signed)
Thank you for coming in to clinic today.  1. I am not exactly positive on what is causing your symptoms - It may be urinary tract infection or developing kidney infection - Start antibiotic Keflex one capsule 3 times a day for 7 days - We sent Urine for a culture to lab, to see if grows bacteria, will notify you within next few days if we need to change or stop your antibiotic, otherwise keep taking it - Use Zofran as needed for nausea / vomiting - Take Aleve 220mg  OTC (x 2 tablets) per dose twice daily with food for up to 1 week then as needed - Improve hydration - STOP Metformin for now, maybe up to 1 week to make sure it is not causing your symptoms as a side effect (diarrhea, abdominal bloating, cramping and pain)  If your symptoms resolve, and doing much better, after 1-2 weeks try to resume the Metformin one a day, to see how you feel  If symptoms worsening, developing nausea / vomiting unable to take meds, worsening back pain, fevers / chills / sweats, then please return for re-evaluation sooner. May go to ED if significant worsening.  Please schedule a follow-up appointment with Dr Ardelia Mems within 1-2 weeks to follow-up Back Pain / Abdominal Pain / Urinary frequency  If you have any other questions or concerns, please feel free to call the clinic to contact me. You may also schedule an earlier appointment if necessary.  However, if your symptoms get significantly worse, please go to the Emergency Department to seek immediate medical attention.  Nobie Putnam, [place]

## 2015-10-22 NOTE — Progress Notes (Signed)
Subjective:    Patient ID: Annette Cabrera, female    DOB: March 30, 1961, 55 y.o.   MRN: [id number]  Annette Cabrera is a 55 y.o. female presenting on 10/22/2015 for Urinary Frequency and Back Pain   Patient presents for a same day appointment.   HPI  UTI / URINARY FREQUENCY / BACK PAIN / DIARRHEA: - Reports that she was recently diagnosed with Type 2 Diabetes (new start on Metformin 500 XR daily 2-3 weeks ago) after being hospitalized for left lower ext cellulitis (treated with variety of antibiotics including finished course Doxycycline on discharge, ended early April 2017), she had been voiding more often over past 2-3 weeks ago, felt bladder fullness and lower abdominal discomfort if she holds urine too long. - Today presents with symptoms started yesterday with worsening lower pelvic pain with voiding (but not dysuria), and significant increased urinary urgency. Then sudden onset watery diarrhea throughout yesterday >10 x improved today without any BMs. Gradual worsening low back ache since yesterday, now worsening today, constant 10/10 severity pain, worse with movements and deep breaths, worse with both diarrhea and voiding. - History of prior low back pain with "spasms", took one naproxen 500 right before office visit, seems maybe helping - Admits some "firmness or stiffness to stomach" for about 1-2 days, with intermittent episodes worse after eating and will feel "bloated" especially after drinking - Admits nausea (without vomiting) - Denies any recent sexual contact, no concerns for potential STD per patient - Denies any sick contacts, fevers/chills/rigors, sweats, chest pain, difficulty breath, dysuria, hematuria, vaginal discharge / odor   Social History  Substance Use Topics  . Smoking status: Never Smoker   . Smokeless tobacco: None  . Alcohol Use: No    Review of Systems Per HPI unless specifically indicated above     Objective:    BP 136/84 mmHg  Pulse 92   Temp(Src) 98.7 F (37.1 C) (Oral)  Ht 5\' 3"  (1.6 m)  Wt 263 lb 8 oz (119.523 kg)  BMI 46.69 kg/m2  SpO2 98%  LMP 09/06/2011  Wt Readings from Last 3 Encounters:  10/22/15 263 lb 8 oz (119.523 kg)  10/08/15 267 lb 9.6 oz (121.383 kg)  09/15/15 268 lb 1.6 oz (121.609 kg)    Physical Exam  Constitutional: She appears well-developed and well-nourished.  Obese, uncomfortable due to back pain, but pleasant and cooperative  HENT:  Head: Normocephalic and atraumatic.  Mouth/Throat: Oropharynx is clear and moist.  Eyes: Conjunctivae are normal.  Cardiovascular: Normal rate, regular rhythm, normal heart sounds and intact distal pulses.   No murmur heard. Pulmonary/Chest: Effort normal and breath sounds normal. No respiratory distress. She has no wheezes. She has no rales.  Abdominal: Soft. Bowel sounds are normal. She exhibits no distension and no mass. There is tenderness (suprapubic mostly with increased bladder pressure on palpation, sense of urgency). There is no rebound and no guarding.  Epigastric region non-tender and soft (localized area where she had experienced discomfort prior)  Musculoskeletal: She exhibits no edema.  Mid/Low Back Inspection: Large body habitus, no deformity Palpation: No tenderness over spinous processes. Bilateral lumbar paraspinal muscles non-tender and without hypertonicity/spasm. Special Testing: Positive costovertbral angle tenderness (CVAT) bilaterally mid back Strength / Neurovascular: intact distal sensation to light touch and ankles 5/5 str  Neurological: She is alert.  Skin: Skin is warm and dry. No rash noted. She is not diaphoretic.  Nursing note and vitals reviewed.  Results for orders placed or performed in visit on  10/22/15  POCT urinalysis dipstick  Result Value Ref Range   Color, UA YELLOW    Clarity, UA CLEAR    Glucose, UA NEG    Bilirubin, UA NEG    Ketones, UA NEG    Spec Grav, UA 1.020    Blood, UA NEG    pH, UA 6.5    Protein, UA  NEG    Urobilinogen, UA 1.0    Nitrite, UA NEG    Leukocytes, UA Negative Negative      Assessment & Plan:   Problem List Items Addressed This Visit    Diabetes type 2, controlled (Annette Cabrera) (Chronic)    Concern that some of current symptoms with diarrhea, abdominal bloating, pain/cramping may be related to Metformin (should be significantly less likely on 500 XR daily), however differential still includes viral gastro vs food poisoning vs UTI/pyelo.  Plan: 1. Advised patient to hold Metformin 500 XR for up to 1 week (also treating for empiric UTI, along with urine culture), if symptoms completely resolve once completed antibiotics and off metformin, then should resume Metformin in 1-2 weeks to see course of symptoms.       Other Visit Diagnoses    Urinary frequency    -  Primary    Relevant Medications    cephALEXin (KEFLEX) 500 MG capsule    Other Relevant Orders    POCT urinalysis dipstick (Completed)    Urine culture    Bilateral thoracic back pain        Diarrhea, unspecified type        Nausea without vomiting        Relevant Medications    ondansetron (ZOFRAN) 4 MG tablet       Unclear etiology of current constellation of symptoms. By history concern for UTI/Pyelo (+frequency, +CVAT, suprapubic pain, inc risk DM) however afebrile and UA is completely negative, suspect urinary frequency could be related to DM2. Additionally concern maybe abdominal symptoms side-effect/intolerence of Metformin (see A&P above), other differential dx include viral gastro (does not seem to explain all symptoms) vs food poisoning (diarrhea was acute and since resolved it seems and no vomiting), unlikely C.Diff with watery diarrhea now x 24 hours (seems resolved, had several antibiotics >1 month ago in hospitalization Doxycycline unlikely to cause this). Precepted with Dr Erin Hearing.  Plan: 1. Check UA / micro - negative for infection 2. Ordered Urine culture 3. Keflex 500mg  TID x 7 days - has PCN  allergy but only n/v as child, discussed potential cross reactivity, agrees to take 4. Zofran PO PRN nausea 5. Improve PO hydration 6. RTC if no improvement within 1 week, red flags given to return sooner   Meds ordered this encounter  Medications  . cephALEXin (KEFLEX) 500 MG capsule    Sig: Take 1 capsule (500 mg total) by mouth 3 (three) times daily. For 7 days    Dispense:  21 capsule    Refill:  0  . ondansetron (ZOFRAN) 4 MG tablet    Sig: Take 1 tablet (4 mg total) by mouth every 8 (eight) hours as needed for nausea or vomiting.    Dispense:  10 tablet    Refill:  0      Follow up plan: Return in about 2 weeks (around 11/05/2015) for back pain, abdominal pain, UTI.  Nobie Putnam, [place], PGY-3

## 2015-10-22 NOTE — Assessment & Plan Note (Addendum)
Concern that some of current symptoms with diarrhea, abdominal bloating, pain/cramping may be related to Metformin (should be significantly less likely on 500 XR daily), however differential still includes viral gastro vs food poisoning vs UTI/pyelo.  Plan: 1. Advised patient to hold Metformin 500 XR for up to 1 week (also treating for empiric UTI, along with urine culture), if symptoms completely resolve once completed antibiotics and off metformin, then should resume Metformin in 1-2 weeks to see course of symptoms.

## 2015-10-23 LAB — URINE CULTURE: Colony Count: 25000

## 2015-11-05 ENCOUNTER — Encounter: Payer: Self-pay | Admitting: Family Medicine

## 2015-11-05 ENCOUNTER — Ambulatory Visit (INDEPENDENT_AMBULATORY_CARE_PROVIDER_SITE_OTHER): Payer: Managed Care, Other (non HMO) | Admitting: Family Medicine

## 2015-11-05 VITALS — BP 127/69 | HR 97 | Temp 98.3°F | Wt 264.0 lb

## 2015-11-05 DIAGNOSIS — Z23 Encounter for immunization: Secondary | ICD-10-CM

## 2015-11-05 DIAGNOSIS — E119 Type 2 diabetes mellitus without complications: Secondary | ICD-10-CM | POA: Diagnosis not present

## 2015-11-05 MED ORDER — FLUCONAZOLE 150 MG PO TABS: 150.0000 mg | ORAL_TABLET | Freq: Once | ORAL | Status: DC

## 2015-11-05 NOTE — Progress Notes (Signed)
Date of Visit: 11/05/2015   HPI:  Patient presents to follow up on diabetes.  I saw her on 4/20 and started metformin for diabetes. She was then seen here on 5/4 for a same day visit due to diarrhea, cramping, back ache. Was instructed to hold metformin for a week. Treated for possible UTI/pyelo with keflex though urine culture did not grow anything.  Patient held metformin for 1 week then resumed it. Symptoms got better while she was off of it, and then a little worse when she restarted it. Having some diarrhea still but overall feels well. States that she actually feels better on metformin than off it. Has bowel movement after every meal, but reports this is normal with her. Wants to stick with metformin.  Of note feels some itching in vaginal area. No abdominal pain, dysuria, back pain, fevers, blood in stool. Does not get periods as she had novasure.  ROS: See HPI.  [place]: history of hypertension, diabetes, menieres disease  PHYSICAL EXAM: BP 127/69 mmHg  Pulse 97  Temp(Src) 98.3 F (36.8 C) (Oral)  Wt 264 lb (119.75 kg)  LMP 09/06/2011 Gen: no acute distress, pleasant, cooperative HEENT: normocephalic, atraumatic  Abdomen: soft nontender to palpation  Diabetic foot exam: 2+ DP pulses bilat, normal monofilament testing bilaterally. No lesions or significant calluses.   ASSESSMENT/PLAN:  Health maintenance:  -foot exam done today -pneumovax given today -colonoscopy handout given to patient today  Diabetes type 2, controlled ([place]) Doing better with tolerating metformin. Continue at present dose.  Cardiac: need to obtain lipids at future visit Renal: on ace Eye: instructed patient to schedule Foot: done today, normal Immunizations: pneumovax today   Vaginal itching - most likely vaginal candidiasis in setting of recent antibiotic treatment. Will rx diflucan 150mg  x1, repeat in 3 days if not better.  FOLLOW UP: Follow up in 2 months for diabetes   [place] J.  Ardelia Mems, [place]

## 2015-11-05 NOTE — Patient Instructions (Signed)
See the handout on how to schedule your colonoscopy. This is an important screening test for colon cancer.   Pneumonia shot today Stick with the metformin  Let me know if you have more problems on it Follow up with me in 2 months  Be well, Dr. Ardelia Mems

## 2015-11-08 NOTE — Assessment & Plan Note (Signed)
Doing better with tolerating metformin. Continue at present dose.  Cardiac: need to obtain lipids at future visit Renal: on ace Eye: instructed patient to schedule Foot: done today, normal Immunizations: pneumovax today

## 2015-11-23 LAB — HM DIABETES EYE EXAM

## 2015-11-25 ENCOUNTER — Other Ambulatory Visit: Payer: Self-pay | Admitting: Family Medicine

## 2015-12-05 ENCOUNTER — Other Ambulatory Visit: Payer: Self-pay | Admitting: Family Medicine

## 2016-01-02 ENCOUNTER — Other Ambulatory Visit: Payer: Self-pay | Admitting: Family Medicine

## 2016-02-16 ENCOUNTER — Ambulatory Visit (INDEPENDENT_AMBULATORY_CARE_PROVIDER_SITE_OTHER): Payer: Managed Care, Other (non HMO) | Admitting: Family Medicine

## 2016-02-16 ENCOUNTER — Ambulatory Visit (HOSPITAL_COMMUNITY)
Admission: RE | Admit: 2016-02-16 | Discharge: 2016-02-16 | Disposition: A | Discharge status: Home / Self Care | Payer: Managed Care, Other (non HMO) | Source: Ambulatory Visit | Attending: Family Medicine | Admitting: Family Medicine

## 2016-02-16 ENCOUNTER — Encounter: Payer: Self-pay | Admitting: Family Medicine

## 2016-02-16 VITALS — BP 145/82 | HR 99 | Temp 97.9°F | Ht 63.0 in | Wt 254.2 lb

## 2016-02-16 DIAGNOSIS — I10 Essential (primary) hypertension: Secondary | ICD-10-CM

## 2016-02-16 DIAGNOSIS — R9431 Abnormal electrocardiogram [ECG] [EKG]: Secondary | ICD-10-CM | POA: Insufficient documentation

## 2016-02-16 DIAGNOSIS — E119 Type 2 diabetes mellitus without complications: Secondary | ICD-10-CM | POA: Diagnosis not present

## 2016-02-16 DIAGNOSIS — R079 Chest pain, unspecified: Secondary | ICD-10-CM | POA: Insufficient documentation

## 2016-02-16 LAB — POCT GLYCOSYLATED HEMOGLOBIN (HGB A1C): Hemoglobin A1C: 5.9

## 2016-02-16 MED ORDER — GLIPIZIDE 5 MG PO TABS: 5.0000 mg | ORAL_TABLET | Freq: Every day | ORAL | 3 refills | Status: DC

## 2016-02-16 MED ORDER — ONETOUCH ULTRA 2 W/DEVICE KIT: PACK | 0 refills | Status: DC

## 2016-02-16 MED ORDER — ONETOUCH ULTRASOFT LANCETS MISC: 11 refills | Status: DC

## 2016-02-16 MED ORDER — RANITIDINE HCL 150 MG PO TABS
150.0000 mg | ORAL_TABLET | Freq: Every day | ORAL | 1 refills | Status: DC | PRN
Start: 2016-02-16 — End: 2017-03-24

## 2016-02-16 MED ORDER — GLUCOSE BLOOD VI STRP: ORAL_STRIP | 11 refills | Status: DC

## 2016-02-16 NOTE — Progress Notes (Addendum)
Date of Visit: 02/16/2016   HPI:  Patient presents for follow up.   Diabetes - A1c 5.9 today. Weight down 10lb. Doing well but having LOTS of diarrhea with metformin. Has made dietary changes and generally feels better, just still having GI upset. No blood in stool. No abdominal pain.  Does not have glucometer. Had to miss additional days of work this month due to diarrhea. Missed 8/1, 8/15, and 8/16. Needs new FMLA paperwork filled out for these absences.  On ROS patient endorsed some chest discomfort. Occurs about once per week. Happens while driving, or sitting at desk. Lasts all day. No associated sweating, nausea, shortness of breath. Feels like she hasn't digested food well. She wonders if it is related to stress. Not worse with exertion.  Blood pressure - did not take medications this morning. Does not check blood pressure at home.  ROS: See HPI.  [place]: history of type 2 diabetes, hypertension, menieres disease  PHYSICAL EXAM: BP (!) 145/82   Pulse 99   Temp 97.9 F (36.6 C) (Oral)   Ht 5\' 3"  (1.6 m)   Wt 254 lb 3.2 oz (115.3 kg)   LMP 09/06/2011 Comment: 5 years ago   BMI 45.03 kg/m  Gen: NAD, pleasant, cooeprative HEENT: normocephalic, atraumatic, moist mucous membranes  Heart: regular rate and rhythm, no murmur Lungs: clear to auscultation bilaterally, normal work of breathing  Neuro: alert, grossly nonfocal, speech normal Ext: No appreciable lower extremity edema bilaterally  Abdomen: soft, nontender to palpation   ASSESSMENT/PLAN:  Health maintenance:  -plan for hep C & HIV testing at next blood draw  Diabetes type 2, controlled ([facility]) A1c 5.9 today. Excellent response to metformin & dietary changes, though GI distress is prohibiting further therapy with metformin. Will stop & switch to glipizide 5mg  daily. Discussed risks/sx of hypoglycemia. Pharmacy team gave teaching today regarding CBG monitoring. rx for meter & supplies sent in. Follow up in 3  months  Cardiac: cannot check lipids today as nonfasting. Plan for future visit. Renal: on ACE Eye: UTD Foot: UTD Immunizations: UTD    Hypertension Blood pressure mildly elevated but did not take medications morning Patient to schedule CPE (needs this for work), will recheck at that visit. Advised to come fasting so we can check lipids.  Chest discomfort Not really cardiac in description, though has never had cardiac evaluation. Suspect may be acid reflux/indigestion. EKG unchanged from prior today. Will do trial of zantac as needed. May also improve with discontinuation of metformin. Counseled on chest pain return precautions, including reasons to go to ED. Low threshold for cardiology referral if does not improve with zantac.  Will complete FMLA paperwork & contact patient when it is ready.  FOLLOW UP: Follow up in 3 mos for routine medical problems Schedule physical   [place] J. Ardelia Mems, [place]

## 2016-02-16 NOTE — Patient Instructions (Signed)
It was great to see you again today!  For diabetes - stop metformin. Take glipizide 5mg  daily. Sent this in for you. Check blood sugar if you ever feel lightheaded, dizzy, sweaty, etc. If low <70, eat something sugary immediately and call us.  For chest discomfort - I think this might be acid reflux. Might get better with stopping metformin. Sent in ranitidine (zantac). You can take this twice daily as needed for the chest discomfort - see if it helps.  If you have any chest pain that does not go away within 30 minutes, is accompanied by nausea, sweating, shortness of breath, or made worse by activity, go to the emergency room immediately for evaluation.   Follow up with me in 3 months, sooner if you need.  Be well, Dr. Ardelia Mems

## 2016-02-19 ENCOUNTER — Telehealth: Payer: Self-pay | Admitting: Family Medicine

## 2016-02-19 NOTE — Assessment & Plan Note (Signed)
A1c 5.9 today. Excellent response to metformin & dietary changes, though GI distress is prohibiting further therapy with metformin. Will stop & switch to glipizide 5mg  daily. Discussed risks/sx of hypoglycemia. Pharmacy team gave teaching today regarding CBG monitoring. rx for meter & supplies sent in. Follow up in 3 months  Cardiac: cannot check lipids today as nonfasting. Plan for future visit. Renal: on ACE Eye: UTD Foot: UTD Immunizations: UTD

## 2016-02-19 NOTE — Telephone Encounter (Signed)
Updated FMLA paperwork completed Will return to [place] RN to contact patient There are sections which patient still needs to complete, so she will need to come pick up papers. Forms will need to be scanned into system  Leeanne Rio, MD

## 2016-02-19 NOTE — Assessment & Plan Note (Signed)
Blood pressure mildly elevated but did not take medications morning Patient to schedule CPE (needs this for work), will recheck at that visit. Advised to come fasting so we can check lipids.

## 2016-02-23 NOTE — Telephone Encounter (Signed)
Patient informed that FMLA was completed, but there were still parts that she needed to be completed.  Patient stated that the forms are due today by 5 PM and requested that nurse fax forms anyway.  She does not get off until 5 PM.  Forms faxed per patient request and copied for scanning in patient's record.  Derl Barrow, RN

## 2016-03-01 ENCOUNTER — Encounter: Payer: Managed Care, Other (non HMO) | Admitting: Family Medicine

## 2016-03-02 ENCOUNTER — Other Ambulatory Visit: Payer: Self-pay | Admitting: Family Medicine

## 2016-03-07 ENCOUNTER — Other Ambulatory Visit (HOSPITAL_COMMUNITY)
Admission: RE | Admit: 2016-03-07 | Discharge: 2016-03-07 | Disposition: A | Discharge status: Home / Self Care | Payer: Managed Care, Other (non HMO) | Source: Ambulatory Visit | Attending: Family Medicine | Admitting: Family Medicine

## 2016-03-07 ENCOUNTER — Encounter: Payer: Self-pay | Admitting: Family Medicine

## 2016-03-07 ENCOUNTER — Ambulatory Visit (INDEPENDENT_AMBULATORY_CARE_PROVIDER_SITE_OTHER): Payer: Managed Care, Other (non HMO) | Admitting: Family Medicine

## 2016-03-07 VITALS — BP 132/96 | HR 91 | Temp 98.5°F | Ht 63.0 in | Wt 254.0 lb

## 2016-03-07 DIAGNOSIS — N760 Acute vaginitis: Secondary | ICD-10-CM | POA: Diagnosis present

## 2016-03-07 DIAGNOSIS — Z23 Encounter for immunization: Secondary | ICD-10-CM

## 2016-03-07 DIAGNOSIS — E119 Type 2 diabetes mellitus without complications: Secondary | ICD-10-CM | POA: Diagnosis not present

## 2016-03-07 DIAGNOSIS — Z113 Encounter for screening for infections with a predominantly sexual mode of transmission: Secondary | ICD-10-CM | POA: Insufficient documentation

## 2016-03-07 DIAGNOSIS — Z0000 Encounter for general adult medical examination without abnormal findings: Secondary | ICD-10-CM | POA: Diagnosis not present

## 2016-03-07 DIAGNOSIS — Z1159 Encounter for screening for other viral diseases: Secondary | ICD-10-CM | POA: Diagnosis not present

## 2016-03-07 NOTE — Patient Instructions (Addendum)
It was great to see you again today!  See the handout on how to schedule your colonoscopy. This is an important screening test for colon cancer.   Flu shot today  Labs today: kidneys, liver, cholesterol, hepatitis C, STD testing  See handout on advanced directives  Follow up with me in 2 months for diabetes   Be well, Dr. Ardelia Mems   Health Maintenance, Female Adopting a healthy lifestyle and getting preventive care can go a long way to promote health and wellness. Talk with your health care provider about what schedule of regular examinations is right for you. This is a good chance for you to check in with your provider about disease prevention and staying healthy. In between checkups, there are plenty of things you can do on your own. Experts have done a lot of research about which lifestyle changes and preventive measures are most likely to keep you healthy. Ask your health care provider for more information. WEIGHT AND DIET  Eat a healthy diet  Be sure to include plenty of vegetables, fruits, low-fat dairy products, and lean protein.  Do not eat a lot of foods high in solid fats, added sugars, or salt.  Get regular exercise. This is one of the most important things you can do for your health.  Most adults should exercise for at least 150 minutes each week. The exercise should increase your heart rate and make you sweat (moderate-intensity exercise).  Most adults should also do strengthening exercises at least twice a week. This is in addition to the moderate-intensity exercise.  Maintain a healthy weight  Body mass index (BMI) is a measurement that can be used to identify possible weight problems. It estimates body fat based on height and weight. Your health care provider can help determine your BMI and help you achieve or maintain a healthy weight.  For females 29 years of age and older:   A BMI below 18.5 is considered underweight.  A BMI of 18.5 to 24.9 is normal.  A  BMI of 25 to 29.9 is considered overweight.  A BMI of 30 and above is considered obese.  Watch levels of cholesterol and blood lipids  You should start having your blood tested for lipids and cholesterol at 54 years of age, then have this test every 5 years.  You may need to have your cholesterol levels checked more often if:  Your lipid or cholesterol levels are high.  You are older than 54 years of age.  You are at high risk for heart disease.  CANCER SCREENING   Lung Cancer  Lung cancer screening is recommended for adults 61-19 years old who are at high risk for lung cancer because of a history of smoking.  A yearly low-dose CT scan of the lungs is recommended for people who:  Currently smoke.  Have quit within the past 15 years.  Have at least a 30-pack-year history of smoking. A pack year is smoking an average of one pack of cigarettes a day for 1 year.  Yearly screening should continue until it has been 15 years since you quit.  Yearly screening should stop if you develop a health problem that would prevent you from having lung cancer treatment.  Breast Cancer  Practice breast self-awareness. This means understanding how your breasts normally appear and feel.  It also means doing regular breast self-exams. Let your health care provider know about any changes, no matter how small.  If you are in your 20s or 30s,  you should have a clinical breast exam (CBE) by a health care provider every 1-3 years as part of a regular health exam.  If you are 21 or older, have a CBE every year. Also consider having a breast X-ray (mammogram) every year.  If you have a family history of breast cancer, talk to your health care provider about genetic screening.  If you are at high risk for breast cancer, talk to your health care provider about having an MRI and a mammogram every year.  Breast cancer gene (BRCA) assessment is recommended for women who have family members with  BRCA-related cancers. BRCA-related cancers include:  Breast.  Ovarian.  Tubal.  Peritoneal cancers.  Results of the assessment will determine the need for genetic counseling and BRCA1 and BRCA2 testing. Cervical Cancer Your health care provider may recommend that you be screened regularly for cancer of the pelvic organs (ovaries, uterus, and vagina). This screening involves a pelvic examination, including checking for microscopic changes to the surface of your cervix (Pap test). You may be encouraged to have this screening done every 3 years, beginning at age 26.  For women ages 38-65, health care providers may recommend pelvic exams and Pap testing every 3 years, or they may recommend the Pap and pelvic exam, combined with testing for human papilloma virus (HPV), every 5 years. Some types of HPV increase your risk of cervical cancer. Testing for HPV may also be done on women of any age with unclear Pap test results.  Other health care providers may not recommend any screening for nonpregnant women who are considered low risk for pelvic cancer and who do not have symptoms. Ask your health care provider if a screening pelvic exam is right for you.  If you have had past treatment for cervical cancer or a condition that could lead to cancer, you need Pap tests and screening for cancer for at least 20 years after your treatment. If Pap tests have been discontinued, your risk factors (such as having a new sexual partner) need to be reassessed to determine if screening should resume. Some women have medical problems that increase the chance of getting cervical cancer. In these cases, your health care provider may recommend more frequent screening and Pap tests. Colorectal Cancer  This type of cancer can be detected and often prevented.  Routine colorectal cancer screening usually begins at 54 years of age and continues through 54 years of age.  Your health care provider may recommend screening at  an earlier age if you have risk factors for colon cancer.  Your health care provider may also recommend using home test kits to check for hidden blood in the stool.  A small camera at the end of a tube can be used to examine your colon directly (sigmoidoscopy or colonoscopy). This is done to check for the earliest forms of colorectal cancer.  Routine screening usually begins at age 50.  Direct examination of the colon should be repeated every 5-10 years through 54 years of age. However, you may need to be screened more often if early forms of precancerous polyps or small growths are found. Skin Cancer  Check your skin from head to toe regularly.  Tell your health care provider about any new moles or changes in moles, especially if there is a change in a mole's shape or color.  Also tell your health care provider if you have a mole that is larger than the size of a pencil eraser.  Always use  sunscreen. Apply sunscreen liberally and repeatedly throughout the day.  Protect yourself by wearing long sleeves, pants, a wide-brimmed hat, and sunglasses whenever you are outside. HEART DISEASE, DIABETES, AND HIGH BLOOD PRESSURE   High blood pressure causes heart disease and increases the risk of stroke. High blood pressure is more likely to develop in:  People who have blood pressure in the high end of the normal range (130-139/85-89 mm Hg).  People who are overweight or obese.  People who are African American.  If you are 33-40 years of age, have your blood pressure checked every 3-5 years. If you are 99 years of age or older, have your blood pressure checked every year. You should have your blood pressure measured twice--once when you are at a hospital or clinic, and once when you are not at a hospital or clinic. Record the average of the two measurements. To check your blood pressure when you are not at a hospital or clinic, you can use:  An automated blood pressure machine at a  pharmacy.  A home blood pressure monitor.  If you are between 36 years and 40 years old, ask your health care provider if you should take aspirin to prevent strokes.  Have regular diabetes screenings. This involves taking a blood sample to check your fasting blood sugar level.  If you are at a normal weight and have a low risk for diabetes, have this test once every three years after 54 years of age.  If you are overweight and have a high risk for diabetes, consider being tested at a younger age or more often. PREVENTING INFECTION  Hepatitis B  If you have a higher risk for hepatitis B, you should be screened for this virus. You are considered at high risk for hepatitis B if:  You were born in a country where hepatitis B is common. Ask your health care provider which countries are considered high risk.  Your parents were born in a high-risk country, and you have not been immunized against hepatitis B (hepatitis B vaccine).  You have HIV or AIDS.  You use needles to inject street drugs.  You live with someone who has hepatitis B.  You have had sex with someone who has hepatitis B.  You get hemodialysis treatment.  You take certain medicines for conditions, including cancer, organ transplantation, and autoimmune conditions. Hepatitis C  Blood testing is recommended for:  Everyone born from [birth year] through 1964.  Anyone with known risk factors for hepatitis C. Sexually transmitted infections (STIs)  You should be screened for sexually transmitted infections (STIs) including gonorrhea and chlamydia if:  You are sexually active and are younger than 54 years of age.  You are older than 54 years of age and your health care provider tells you that you are at risk for this type of infection.  Your sexual activity has changed since you were last screened and you are at an increased risk for chlamydia or gonorrhea. Ask your health care provider if you are at risk.  If you do not  have HIV, but are at risk, it may be recommended that you take a prescription medicine daily to prevent HIV infection. This is called pre-exposure prophylaxis (PrEP). You are considered at risk if:  You are sexually active and do not regularly use condoms or know the HIV status of your partner(s).  You take drugs by injection.  You are sexually active with a partner who has HIV. Talk with your health care provider  about whether you are at high risk of being infected with HIV. If you choose to begin PrEP, you should first be tested for HIV. You should then be tested every 3 months for as long as you are taking PrEP.  PREGNANCY   If you are premenopausal and you may become pregnant, ask your health care provider about preconception counseling.  If you may become pregnant, take 400 to 800 micrograms (mcg) of folic acid every day.  If you want to prevent pregnancy, talk to your health care provider about birth control (contraception). OSTEOPOROSIS AND MENOPAUSE   Osteoporosis is a disease in which the bones lose minerals and strength with aging. This can result in serious bone fractures. Your risk for osteoporosis can be identified using a bone density scan.  If you are 13 years of age or older, or if you are at risk for osteoporosis and fractures, ask your health care provider if you should be screened.  Ask your health care provider whether you should take a calcium or vitamin D supplement to lower your risk for osteoporosis.  Menopause may have certain physical symptoms and risks.  Hormone replacement therapy may reduce some of these symptoms and risks. Talk to your health care provider about whether hormone replacement therapy is right for you.  HOME CARE INSTRUCTIONS   Schedule regular health, dental, and eye exams.  Stay current with your immunizations.   Do not use any tobacco products including cigarettes, chewing tobacco, or electronic cigarettes.  If you are pregnant, do not  drink alcohol.  If you are breastfeeding, limit how much and how often you drink alcohol.  Limit alcohol intake to no more than 1 drink per day for nonpregnant women. One drink equals 12 ounces of beer, 5 ounces of wine, or 1 ounces of hard liquor.  Do not use street drugs.  Do not share needles.  Ask your health care provider for help if you need support or information about quitting drugs.  Tell your health care provider if you often feel depressed.  Tell your health care provider if you have ever been abused or do not feel safe at home.   This information is not intended to replace advice given to you by your health care provider. Make sure you discuss any questions you have with your health care provider.   Document Released: 12/20/2010 Document Revised: 06/27/2014 Document Reviewed: 05/08/2013 Elsevier Interactive Patient Education [employer].

## 2016-03-07 NOTE — Progress Notes (Signed)
Date of Visit: 03/07/2016   HPI:  Patient presents today for a well woman exam.   Concerns today: none Periods: does not get, has had ablation Contraception: n/a ablation Pelvic symptoms: no vaginal bleeding, spotting, pelvic pain, or vaginal discharge Sexual activity: none recently STD Screening: would like STD testing today Pap smear status: had normal pap & neg HPV last year (though absent transformation zone) Exercise: working on it Diet: has cut out sweets, bread, rice Smoking: no Alcohol: no Drugs: no Advance directives: would like info today Mood: generally good Dentist: has dentist but has not been in a while, knows she should schedule teeth cleaning Moles: has one area on R breast that's been there for a long time, not changing, has been told it's benign in the past. Does not bother her.  Updates from last visit: -chest discomfort much better with zantac -tolerating glipizide well. Stopping metformin resulted in marked improvement in diarrhea. Has felt shaky a little on glipizide. Able to check sugars. Lowest she's had are 82. Feels a little shaky when it gets this low. Is working on eating regularly to prevent feeling like sugar is low.  ROS: See HPI  [place]:  Cancers in family: dad with lung cancer, died at 26, dx at 74, had smoked for a long time  PHYSICAL EXAM: BP (!) 132/96   Pulse 91   Temp 98.5 F (36.9 C) (Oral)   Ht 5\' 3"  (1.6 m)   Wt 254 lb (115.2 kg)   LMP 09/06/2011 Comment: 5 years ago   BMI 44.99 kg/m  Gen: NAD, pleasant, cooperative HEENT: NCAT, PERRL, no palpable thyromegaly or anterior cervical lymphadenopathy Heart: RRR, no murmurs Lungs: CTAB, NWOB Abdomen: soft, nontender to palpation Neuro: grossly nonfocal, speech normal GU: normal appearing external genitalia without lesions. Vagina is moist with white discharge. Cervix normal in appearance. No cervical motion tenderness or tenderness on bimanual exam. No adnexal masses.  Skin:  seborrheic keratosis on R inner breast, not inflamed  ASSESSMENT/PLAN:  Health maintenance:  -STD screening: gc/chl/trich, HIV, RPR today -pap smear: discussed last year's result & absence of transformation zone but since cytology & hpv were neg, can space paps to 5 years -mammogram: UTD -lipid screening: check today, also CMET -colonoscopy: given handout on how to schedule -hep C screening: check with labs today -immunizations: flu shot today -advance directives: given packet of information, encouraged to fill it out. Would want to be full code as of now. -handout given on health maintenance topics  Diabetes type 2, controlled ([place]) Tolerating glipizide thus far. Follow up in 2 months. Advised to let us know if she persistently gets sugars in 80s, or if she ever has them less than 70 as may mean we need to decrease dose of glipizide.   Note patient left form to complete for work with lab results. We will call her with this once resulted & completed.  FOLLOW UP: Follow up in 2 mos for diabetes   [place] J. Ardelia Mems, [place]

## 2016-03-08 LAB — LIPID PANEL
CHOLESTEROL: 220 mg/dL — AB (ref 125–200)
HDL: 69 mg/dL (ref >=46)
LDL Cholesterol: 121 mg/dL (ref <130)
TRIGLYCERIDES: 150 mg/dL — AB (ref <150)
Total CHOL/HDL Ratio: 3.2 Ratio (ref <=5.0)
VLDL: 30 mg/dL (ref <30)

## 2016-03-08 LAB — CERVICOVAGINAL ANCILLARY ONLY
CHLAMYDIA, DNA PROBE: NEGATIVE
Neisseria Gonorrhea: NEGATIVE
TRICH (WINDOWPATH): NEGATIVE

## 2016-03-08 LAB — HIV ANTIBODY (ROUTINE TESTING W REFLEX): HIV: NONREACTIVE

## 2016-03-08 LAB — COMPLETE METABOLIC PANEL WITH GFR
ALBUMIN: 4.5 g/dL (ref 3.6–5.1)
ALK PHOS: 89 U/L (ref 33–130)
ALT: 16 U/L (ref 6–29)
AST: 18 U/L (ref 10–35)
BILIRUBIN TOTAL: 1.4 mg/dL — AB (ref 0.2–1.2)
BUN: 11 mg/dL (ref 7–25)
CHLORIDE: 99 mmol/L (ref 98–110)
CO2: 24 mmol/L (ref 20–31)
Calcium: 9.6 mg/dL (ref 8.6–10.4)
Creat: 0.56 mg/dL (ref 0.50–1.05)
GFR, Est African American: >89 mL/min (ref >=60)
GFR, Est Non African American: >89 mL/min (ref >=60)
GLUCOSE: 73 mg/dL (ref 65–99)
POTASSIUM: 3.6 mmol/L (ref 3.5–5.3)
SODIUM: 141 mmol/L (ref 135–146)
Total Protein: 8 g/dL (ref 6.1–8.1)

## 2016-03-08 LAB — RPR

## 2016-03-08 LAB — HEPATITIS C ANTIBODY: HCV Ab: NEGATIVE

## 2016-03-08 NOTE — Assessment & Plan Note (Signed)
Tolerating glipizide thus far. Follow up in 2 months. Advised to let us know if she persistently gets sugars in 80s, or if she ever has them less than 70 as may mean we need to decrease dose of glipizide.

## 2016-03-11 ENCOUNTER — Encounter: Payer: Self-pay | Admitting: Family Medicine

## 2016-03-11 ENCOUNTER — Telehealth: Payer: Self-pay | Admitting: Family Medicine

## 2016-03-11 NOTE — Telephone Encounter (Signed)
Attempted to reach patient to discuss results. No answer on either home line or cell phone, no voicemail set up on either.  I will send letter asking her to call us to discuss results.  Summary: - needs statin. Cholesterol up, and has 10 year ASCVD risk calculated >10%.  Recommend lipitor 40mg  daily. - bilirubin mildly up, suspect this is transient and not a problem. Plan to repeat at next office visit. - STD testing all negative  Also need to let her know that I have her form for work filled out with lab results. She will need to come pick it up. When she picks it up, we need to measure her waist circumference (we didn't do this at her visit). It's an item on the form.  When patient returns call please page me so I can speak with her  Leeanne Rio, MD

## 2016-04-02 ENCOUNTER — Other Ambulatory Visit: Payer: Self-pay | Admitting: Family Medicine

## 2016-04-05 ENCOUNTER — Ambulatory Visit (INDEPENDENT_AMBULATORY_CARE_PROVIDER_SITE_OTHER): Payer: Managed Care, Other (non HMO) | Admitting: Family Medicine

## 2016-04-05 ENCOUNTER — Encounter: Payer: Self-pay | Admitting: Family Medicine

## 2016-04-05 VITALS — BP 146/73 | HR 88 | Temp 98.0°F | Ht 63.0 in | Wt 254.8 lb

## 2016-04-05 DIAGNOSIS — R1013 Epigastric pain: Secondary | ICD-10-CM

## 2016-04-05 DIAGNOSIS — E119 Type 2 diabetes mellitus without complications: Secondary | ICD-10-CM | POA: Diagnosis not present

## 2016-04-05 LAB — COMPLETE METABOLIC PANEL WITH GFR
ALBUMIN: 4.4 g/dL (ref 3.6–5.1)
ALK PHOS: 93 U/L (ref 33–130)
ALT: 14 U/L (ref 6–29)
AST: 15 U/L (ref 10–35)
BILIRUBIN TOTAL: 1.3 mg/dL — AB (ref 0.2–1.2)
BUN: 8 mg/dL (ref 7–25)
CALCIUM: 9.9 mg/dL (ref 8.6–10.4)
CO2: 28 mmol/L (ref 20–31)
CREATININE: 0.66 mg/dL (ref 0.50–1.05)
Chloride: 98 mmol/L (ref 98–110)
GFR, Est African American: >89 mL/min (ref >=60)
GFR, Est Non African American: >89 mL/min (ref >=60)
Glucose, Bld: 79 mg/dL (ref 65–99)
POTASSIUM: 3.8 mmol/L (ref 3.5–5.3)
Sodium: 140 mmol/L (ref 135–146)
Total Protein: 7.8 g/dL (ref 6.1–8.1)

## 2016-04-05 LAB — CBC WITH DIFFERENTIAL/PLATELET
Basophils Absolute: 0 cells/uL (ref 0–200)
Basophils Relative: 0 %
EOS PCT: 4 %
Eosinophils Absolute: 296 cells/uL (ref 15–500)
HCT: 42.5 % (ref 35.0–45.0)
Hemoglobin: 14.4 g/dL (ref 11.7–15.5)
LYMPHS ABS: 3108 {cells}/uL (ref 850–3900)
LYMPHS PCT: 42 %
MCH: 28.6 pg (ref 27.0–33.0)
MCHC: 33.9 g/dL (ref 32.0–36.0)
MCV: 84.5 fL (ref 80.0–100.0)
MONOS PCT: 7 %
MPV: 9.7 fL (ref 7.5–12.5)
Monocytes Absolute: 518 cells/uL (ref 200–950)
NEUTROS PCT: 47 %
Neutro Abs: 3478 cells/uL (ref 1500–7800)
Platelets: 365 10*3/uL (ref 140–400)
RBC: 5.03 MIL/uL (ref 3.80–5.10)
RDW: 14.2 % (ref 11.0–15.0)
WBC: 7.4 10*3/uL (ref 3.8–10.8)

## 2016-04-05 LAB — LIPASE: Lipase: 38 U/L (ref 7–60)

## 2016-04-05 MED ORDER — OMEPRAZOLE 20 MG PO CPDR: 20.0000 mg | DELAYED_RELEASE_CAPSULE | Freq: Every day | ORAL | 1 refills | Status: DC

## 2016-04-05 NOTE — Patient Instructions (Signed)
It was great to see you again today!  I'm sorry your stomach is giving you so much trouble We are checking labs and an ultrasound I'm concerned this is due to gallstones (see handout below)  If pain worsens or you are vomiting, unable to keep anything down please go to the ER.  Start omeprazole - an acid reflux medicine  Stop the glipizide for now while you aren't able to eat as much as normal.  We'll start cholesterol medicine another time, not today while all this is going on.  Follow up with me in 1-2 weeks, sooner if needed.  Be well, Dr. Ardelia Mems

## 2016-04-05 NOTE — Progress Notes (Signed)
Date of Visit: 04/05/2016   HPI:  Patient presents for a same day appointment to discuss not feeling well.  Last Saturday ate some jerk chicken. After that felt nauseated but no vomiting. Since then has had pain in epigastric area after eating. Worse with eating spicy or fatty foods. No vomiting. No fever. No bowel movement in 2 days, but thinks that's because she's eating less due to stomach issues. No blood in stool. Only has the pain in epigastric area after eating, not other times. Has never had cholecystectomy. No shortness of breath.  Of note yesterday CBG was 57 - she was quite symptomatic at the time. Taking glipizide 5mg  daily.   ROS: See HPI  [place]: history of diabetes, hypertension, hyperlipidemia, menieres disease  PHYSICAL EXAM: BP (!) 146/73   Pulse 88   Temp 98 F (36.7 C) (Oral)   Ht 5\' 3"  (1.6 m)   Wt 254 lb 12.8 oz (115.6 kg)   LMP 09/06/2011 Comment: 5 years ago   BMI 45.14 kg/m  Gen: NAD, pleasant, cooperative HEENT: normocephalic, atraumatic moist mucous membranes  Heart: regular rate and rhythm, no murmur Lungs: clear to auscultation bilaterally, normal work of breathing  Abdomen: soft. No masses. No organomegaly. No peritoneal signs. + mildly ttp in epigastric and ruq area. Negative murphy sign Neuro: alert grossly nonfocal speech normal  ASSESSMENT/PLAN:  1. Epigastric pain - differential diagnosis includes pancreatitis, biliary colic, acute cholecystitis, ulcer, etc. At this time favor biliary colic given risk factors (age, gender, weight). Abdomen is overall benign without any signs of need for urgent surgical evaluation. Could also be gastritis vs GERD. - labs: CMET, lipase, CBC with diff - abdominal ultrasound to eval for gallstones - add omeprazole to help with possibility of gastritis or GERD - discussed return precautions per after visit summary  Diabetes type 2, controlled ([place]) Hypoglycemic yesterday, likely due to decreased PO intake with  abdominal pain. Patient instructed to hold glipizide until she is feeling better/until workup is complete to decrease risk of further hypoglycemia    FOLLOW UP: Follow up in 1-2 weeks for above issues  [place] J. Ardelia Mems, [place]

## 2016-04-10 NOTE — Assessment & Plan Note (Signed)
Hypoglycemic yesterday, likely due to decreased PO intake with abdominal pain. Patient instructed to hold glipizide until she is feeling better/until workup is complete to decrease risk of further hypoglycemia

## 2016-04-11 ENCOUNTER — Telehealth: Payer: Self-pay | Admitting: Family Medicine

## 2016-04-11 ENCOUNTER — Ambulatory Visit (HOSPITAL_COMMUNITY)
Admission: RE | Admit: 2016-04-11 | Discharge: 2016-04-11 | Disposition: A | Discharge status: Home / Self Care | Payer: Managed Care, Other (non HMO) | Source: Ambulatory Visit | Attending: Family Medicine | Admitting: Family Medicine

## 2016-04-11 ENCOUNTER — Other Ambulatory Visit: Payer: Self-pay | Admitting: *Deleted

## 2016-04-11 DIAGNOSIS — K760 Fatty (change of) liver, not elsewhere classified: Secondary | ICD-10-CM | POA: Insufficient documentation

## 2016-04-11 DIAGNOSIS — R1013 Epigastric pain: Secondary | ICD-10-CM | POA: Diagnosis not present

## 2016-04-11 MED ORDER — GLIPIZIDE 5 MG PO TABS: 5.0000 mg | ORAL_TABLET | Freq: Every day | ORAL | 3 refills | Status: DC

## 2016-04-11 NOTE — Telephone Encounter (Signed)
Called patient to discuss ultrasound results No gallstones. Only finding was fatty liver.  Patient reports she's feeling much better. Not 100% back to normal, but now not having as much pain, and is able to eat more normally. Has not yet resumed glipizide.  Advised giving it more time since she's so much better. She will resume glipizide and use caution with checking blood sugars to watch for lows. If symptoms return or worsen she will call me and we'll proceed from there. Likely next step would be HIDA scan vs CT abdomen.  Also reviewed that bili was improved but still very mildly elevated 1.3 (which is likely within the error range for the test). Will plan to repeat in about 3 months  Patient appreciative and agreeable.   Leeanne Rio, MD

## 2016-04-11 NOTE — Telephone Encounter (Signed)
Late entry - discussed this during office visit last week Form was given to her  & abdominal circumference checked during that visit  Closing phone encounter Leeanne Rio, MD

## 2016-04-11 NOTE — Telephone Encounter (Signed)
See separate phone note.

## 2016-04-11 NOTE — Telephone Encounter (Signed)
Pt wants to be called at [phone].  This is her work number

## 2016-04-19 ENCOUNTER — Ambulatory Visit: Payer: Managed Care, Other (non HMO) | Admitting: Family Medicine

## 2016-05-09 ENCOUNTER — Encounter: Payer: Self-pay | Admitting: Family Medicine

## 2016-05-09 ENCOUNTER — Ambulatory Visit (INDEPENDENT_AMBULATORY_CARE_PROVIDER_SITE_OTHER): Payer: Managed Care, Other (non HMO) | Admitting: Family Medicine

## 2016-05-09 VITALS — BP 138/82 | HR 94 | Temp 97.8°F | Ht 63.0 in | Wt 255.0 lb

## 2016-05-09 DIAGNOSIS — J4520 Mild intermittent asthma, uncomplicated: Secondary | ICD-10-CM

## 2016-05-09 DIAGNOSIS — E785 Hyperlipidemia, unspecified: Secondary | ICD-10-CM | POA: Insufficient documentation

## 2016-05-09 DIAGNOSIS — Z72820 Sleep deprivation: Secondary | ICD-10-CM

## 2016-05-09 DIAGNOSIS — K219 Gastro-esophageal reflux disease without esophagitis: Secondary | ICD-10-CM

## 2016-05-09 DIAGNOSIS — I10 Essential (primary) hypertension: Secondary | ICD-10-CM

## 2016-05-09 DIAGNOSIS — J309 Allergic rhinitis, unspecified: Secondary | ICD-10-CM

## 2016-05-09 DIAGNOSIS — G479 Sleep disorder, unspecified: Secondary | ICD-10-CM

## 2016-05-09 DIAGNOSIS — E119 Type 2 diabetes mellitus without complications: Secondary | ICD-10-CM | POA: Diagnosis not present

## 2016-05-09 LAB — POCT GLYCOSYLATED HEMOGLOBIN (HGB A1C): HEMOGLOBIN A1C: 6.1

## 2016-05-09 MED ORDER — MOMETASONE FUROATE 50 MCG/ACT NA SUSP: 2.0000 | Freq: Every day | NASAL | 12 refills | Status: AC

## 2016-05-09 MED ORDER — ATORVASTATIN CALCIUM 40 MG PO TABS: 40.0000 mg | ORAL_TABLET | Freq: Every day | ORAL | 3 refills | Status: DC

## 2016-05-09 MED ORDER — GLIPIZIDE 5 MG PO TABS: 2.5000 mg | ORAL_TABLET | Freq: Every day | ORAL | 3 refills | Status: DC

## 2016-05-09 NOTE — Patient Instructions (Addendum)
Great to see you today.  nasonex 2 sprays each nostril once per day  Lower glipizide to 2.5mg  daily (break the 5mg  pill in half)  See the handout on how to schedule your colonoscopy. This is an important screening test for colon cancer.   Setting up for sleep study - someone will call with an appointment  Start lipitor 40mg  daily for cholesterol  Follow up with me in 3 months. We'll recheck bilirubin level at that time.  Happy Holidays!  Dr. Ardelia Mems

## 2016-05-09 NOTE — Progress Notes (Addendum)
Date of Visit: 05/09/2016   HPI:  Patient presents for routine follow up.   Diabetes - taking glipizide 61m daily. Having daily low blood sugars on this, though has not checked her sugar recently. Can tell sugar is low and eats something, which helps her feel better. a1c today 6.1. Denies chest pain, shortness of breath, swelling.  Hypertension - taking amlodipine 111mdaily, lisinopril 2042maily, HCTZ 33m72mily. Tolerating these well. Also takes 40me61mtassium daily. See above ROS.   GERD - takes zantac 150mg 28my as needed for heartburn. Doing well on this. Does not take PPI I previously prescribed when she was having abdominal pain. This all resolved - she thinks it was just a bad case of food poisoning.  Allergic rhinitis- taking allegra but wants to know if she can take something else as she has a lot of runny nose & postnasal drip. Also notes history of wheezing & reports history of asthma, no wheezing presently.  Sleep issues - does not sleep well. Wakes up in the morning with headaches. Endorses snoring. Stays busy at night and often doesn't get enough sleep because of this. Has never had a sleep study.  Pelvic discomfort - has cramping like a period about once per month. Had novasure ablation a few years ago. No vaginal bleeding. No pain now, just happens every so often. Lasts a couple of days at a time. Patient does not want to work this up further right now.  Hyperlipidemia - not on statin, we had deferred this as she wasn't feeling well before. Willing to start lipitor.  ROS: See HPI.  PMFSH:[place] of type 2 diabetes, hypertension, menieres disease, allergic rhinitis, hyperlipidemia   PHYSICAL EXAM: BP 138/82   Pulse 94   Temp 97.8 F (36.6 C) (Oral)   Ht '5\' 3"'  (1.6 m)   Wt 255 lb (115.7 kg)   LMP 09/06/2011 Comment: 5 years ago   BMI 45.17 kg/m  Gen: NAD pleasant, cooperative HEENT: normocephalic, atraumatic, moist mucous membranes. Tympanic membranes clear  bilaterally. No anterior cervical or supraclavicular lymphadenopathy.. Significant rhinitis. Nasal turbinates enlarged/boggy. Heart: regular rate and rhythm, no murmur Lungs: clear to auscultation bilaterally, normal work of breathing  Neuro: alert grossly nonfocal speech normal Ext: No appreciable lower extremity edema bilaterally  Abdomen: soft nontender to palpation  ASSESSMENT/PLAN:  Health maintenance:  -utd on all health maintenance except colonoscopy. Gave her another handout on how to schedule. She is motivated to get it done this year as she has already met her deductible.  Hyperlipidemia New dx based on recent lipid panel. Start lipitor 40mg d44m. rx sent in. Will need hepatic function panel at next office visit to recheck mildly elevated bilirubin on last lab draw.  Diabetes type 2, controlled (HCC) WeOlliecontrolled (perhaps too well) on glipizide with A1c of 6.1. Daily low sugars are concerning- will decrease glipizide dose to 2.5mg dai80m Follow up in 3 months.  Cardiac: start statin today. Consider aspirin in future (evidence for primary prevention of CVD in diabetes is weak, but can discuss with patient in the future) Renal: on ACE Eye: UTD Foot: UTD Immunizations: UTD   Hypertension Well controlled. Continue current regimen.   Allergic rhinitis Nasal turbinates quite edematous on exam - with significant rhinitis symptoms.  Will add mometasone daily to allegra. Follow up in 3 months, recheck size of turbinates at that time.  Sleep difficulties At risk for OSA given body habitus. With snoring, hypertension, morning headaches, suspect OSA. Will obtain sleep  study.  GERD (gastroesophageal reflux disease) Well controlled on as needed ranitidine. Continue current management.  Asthma Patient reported to me today that she has history of asthma, not on medications.  Will send in albuterol inhaler for her to have in case she needs it  Pelvic cramping - occurs monthly,  though is s/p ablation so no bleeding. Advised patient we could proceed with workup for this (would probably start with pelvic ultrasound) but she stated it's not that bothersome and she doesn't want to do anything about it at this time.  FOLLOW UP: Follow up in 3 months for chronic medical problems.  [place]. Annette Cabrera, [place]

## 2016-05-09 NOTE — Progress Notes (Signed)
poc

## 2016-05-10 DIAGNOSIS — K219 Gastro-esophageal reflux disease without esophagitis: Secondary | ICD-10-CM | POA: Insufficient documentation

## 2016-05-10 DIAGNOSIS — G479 Sleep disorder, unspecified: Secondary | ICD-10-CM | POA: Insufficient documentation

## 2016-05-10 DIAGNOSIS — J45909 Unspecified asthma, uncomplicated: Secondary | ICD-10-CM | POA: Insufficient documentation

## 2016-05-10 MED ORDER — ALBUTEROL SULFATE HFA 108 (90 BASE) MCG/ACT IN AERS: 2.0000 | INHALATION_SPRAY | Freq: Four times a day (QID) | RESPIRATORY_TRACT | 3 refills | Status: DC | PRN

## 2016-05-10 NOTE — Assessment & Plan Note (Signed)
Well controlled (perhaps too well) on glipizide with A1c of 6.1. Daily low sugars are concerning- will decrease glipizide dose to 2.5mg  daily. Follow up in 3 months.  Cardiac: start statin today. Consider aspirin in future (evidence for primary prevention of CVD in diabetes is weak, but can discuss with patient in the future) Renal: on ACE Eye: UTD Foot: UTD Immunizations: UTD

## 2016-05-10 NOTE — Assessment & Plan Note (Signed)
At risk for OSA given body habitus. With snoring, hypertension, morning headaches, suspect OSA. Will obtain sleep study.

## 2016-05-10 NOTE — Assessment & Plan Note (Signed)
Nasal turbinates quite edematous on exam - with significant rhinitis symptoms.  Will add mometasone daily to allegra. Follow up in 3 months, recheck size of turbinates at that time.

## 2016-05-10 NOTE — Assessment & Plan Note (Addendum)
New dx based on recent lipid panel. Start lipitor 40mg  daily. rx sent in. Will need hepatic function panel at next office visit to recheck mildly elevated bilirubin on last lab draw.

## 2016-05-10 NOTE — Assessment & Plan Note (Signed)
Patient reported to me today that she has history of asthma, not on medications.  Will send in albuterol inhaler for her to have in case she needs it

## 2016-05-10 NOTE — Addendum Note (Signed)
Addended by: Leeanne Rio on: 05/10/2016 09:14 AM   Modules accepted: Orders

## 2016-05-10 NOTE — Assessment & Plan Note (Signed)
Well controlled on as needed ranitidine. Continue current management.

## 2016-05-10 NOTE — Assessment & Plan Note (Signed)
Well-controlled.  Continue current regimen. 

## 2016-08-03 ENCOUNTER — Telehealth: Payer: Self-pay | Admitting: Family Medicine

## 2016-08-03 NOTE — Telephone Encounter (Signed)
Called patient's cell to discuss, no answer/no voicemail Called her work line (which is a monitored/recorded line so we don't usually discuss in detail on there) She has appointment with me on Tuesday and said this could wait until then to discuss I have her forms, will wait to talk to her about them on Tuesday   Leeanne Rio, MD

## 2016-08-03 NOTE — Telephone Encounter (Signed)
FMLA paperwork was faxed from pt 07-27-16. She is wanting to know the status on the form. She is hoping it was faxed back because her FMLA runs out today. Please advise

## 2016-08-09 ENCOUNTER — Ambulatory Visit (INDEPENDENT_AMBULATORY_CARE_PROVIDER_SITE_OTHER): Payer: 59 | Admitting: Family Medicine

## 2016-08-09 VITALS — BP 130/90 | HR 96 | Temp 98.6°F | Wt 256.0 lb

## 2016-08-09 DIAGNOSIS — I10 Essential (primary) hypertension: Secondary | ICD-10-CM | POA: Diagnosis not present

## 2016-08-09 DIAGNOSIS — J309 Allergic rhinitis, unspecified: Secondary | ICD-10-CM

## 2016-08-09 DIAGNOSIS — E119 Type 2 diabetes mellitus without complications: Secondary | ICD-10-CM | POA: Diagnosis not present

## 2016-08-09 DIAGNOSIS — R17 Unspecified jaundice: Secondary | ICD-10-CM | POA: Diagnosis not present

## 2016-08-09 DIAGNOSIS — H8109 Meniere's disease, unspecified ear: Secondary | ICD-10-CM | POA: Diagnosis not present

## 2016-08-09 DIAGNOSIS — G479 Sleep disorder, unspecified: Secondary | ICD-10-CM

## 2016-08-09 LAB — COMPLETE METABOLIC PANEL WITH GFR
ALT: 18 U/L (ref 6–29)
AST: 16 U/L (ref 10–35)
Albumin: 4.3 g/dL (ref 3.6–5.1)
Alkaline Phosphatase: 108 U/L (ref 33–130)
BILIRUBIN TOTAL: 1.1 mg/dL (ref 0.2–1.2)
BUN: 9 mg/dL (ref 7–25)
CO2: 27 mmol/L (ref 20–31)
Calcium: 9.4 mg/dL (ref 8.6–10.4)
Chloride: 103 mmol/L (ref 98–110)
Creat: 0.66 mg/dL (ref 0.50–1.05)
GFR, Est African American: >89 mL/min (ref >=60)
GLUCOSE: 89 mg/dL (ref 65–99)
POTASSIUM: 3.9 mmol/L (ref 3.5–5.3)
SODIUM: 140 mmol/L (ref 135–146)
TOTAL PROTEIN: 7.5 g/dL (ref 6.1–8.1)

## 2016-08-09 LAB — POCT GLYCOSYLATED HEMOGLOBIN (HGB A1C): Hemoglobin A1C: 6.3

## 2016-08-09 NOTE — Patient Instructions (Signed)
Diabetes looks great  See the handout on how to schedule your colonoscopy. This is an important screening test for colon cancer.   Follow up with me in 3 months for diabetes  Will follow up on getting your sleep study scheduled  Be well, Dr. Ardelia Mems

## 2016-08-09 NOTE — Progress Notes (Signed)
Date of Visit: 08/09/2016   HPI:  Patient presents for routine follow up.  Hypertension - taking amlodipine, lisinopril & HCTZ. No chest pain or shortness of breath.   Diabetes - taking glipizide 5mg  daily. Tolerating this well. No low sugars (she figured out how to regulate how often she is eating to avoid lows).  Menieres disease - had flare up in January. Has not been to ENT doc in a while, was told there that she needed to come in during a flare in order for them to fully dx it. Had to miss several days of work in January during the flare. Symptoms are now resolved. Flare consisted of difficulty hearing & vertigo.  Allergic rhinitis - moving so she has been around dust & mold, allergies flaring somewhat right now  Fatigue - having daytime sleepiness. Previously had sleep study ordered but does not appear sleep center ever contacted her to schedule it.  ROS: See HPI.  [place]: history of menieres disease, diabetes, hypertension, hyperlipidemia, allergic rhinitis, GERD  PHYSICAL EXAM: BP 130/90 (BP Location: Right Arm, Patient Position: Sitting, Cuff Size: Normal)   Pulse 96   Temp 98.6 F (37 C) (Oral)   Wt 256 lb (116.1 kg)   LMP 09/06/2011 Comment: 5 years ago   SpO2 97%   BMI 45.35 kg/m  Gen: NAD, pleasant, cooperative HEENT: normocephalic, atraumatic, moist mucous membranes. Nasal turbinates remain enlarged/boggy Heart:  Regular rate and rhythm, no murmur Lungs: clear to auscultation bilaterally normal work of breathing  Neuro: alert, grossly nonfocal, speech normal Ext: No appreciable lower extremity edema bilaterally   ASSESSMENT/PLAN:  Health maintenance:  -given another colonoscopy handout today & instructed to schedule -otherwise UTD on health maintenance  Meniere's disease Flareup in January causing her to miss work Will update FMLA paperwork to include 1 day/mo off work for [employer] flares (we can adjust this later if need be) Encouraged her to get in with  ENT doctor when she has a flareup - she should call & schedule visit.  Hypertension Well controlled. Continue current regimen. Recheck CMET today to ensure bilirubin normalized.  Allergic rhinitis Turbinates remain boggy, though is moving & exposed to allergens Encouraged daily mometasone nasal spray use. Continue claritin. Recheck turbinates at next visit.  Diabetes type 2, controlled ([place]) Continues with good control with A1c of 6.3. Taking glipizide 5mg  daily (despite instruction at last visit to go down - appears her pharmacy gave her the old dose). In any case, she is tolerating the full 5mg  of glipizide well. Continue this dose.  Cardiac: on statin. Consider asa at future visit Renal: on ACE Eye: UTD Foot: UTD Immunizations: UTD   Sleep difficulties For some reason, sleep study was never scheduled CMA [place] will follow up with sleep center to be sure this gets scheduled, as she is now having daytime sleepiness.  FOLLOW UP: Follow up in 3 mos for above issues  [place] J. Ardelia Mems, [place]

## 2016-08-10 ENCOUNTER — Encounter: Payer: Self-pay | Admitting: Family Medicine

## 2016-08-10 ENCOUNTER — Telehealth: Payer: Self-pay | Admitting: Family Medicine

## 2016-08-10 NOTE — Assessment & Plan Note (Signed)
Continues with good control with A1c of 6.3. Taking glipizide 5mg  daily (despite instruction at last visit to go down - appears her pharmacy gave her the old dose). In any case, she is tolerating the full 5mg  of glipizide well. Continue this dose.  Cardiac: on statin. Consider asa at future visit Renal: on ACE Eye: UTD Foot: UTD Immunizations: UTD

## 2016-08-10 NOTE — Assessment & Plan Note (Signed)
Turbinates remain boggy, though is moving & exposed to allergens Encouraged daily mometasone nasal spray use. Continue claritin. Recheck turbinates at next visit.

## 2016-08-10 NOTE — Telephone Encounter (Signed)
FMLA paperwork completed per patient request Authorized: 2 visits q 3 months, 1 flareup q month  Will return to nursing team to scan copy into chart & fax back. Please contact patient when it is ready  Thanks, Leeanne Rio, MD

## 2016-08-10 NOTE — Assessment & Plan Note (Signed)
Well controlled. Continue current regimen. Recheck CMET today to ensure bilirubin normalized.

## 2016-08-10 NOTE — Assessment & Plan Note (Signed)
Flareup in January causing her to miss work Will update FMLA paperwork to include 1 day/mo off work for [employer] flares (we can adjust this later if need be) Encouraged her to get in with ENT doctor when she has a flareup - she should call & schedule visit.

## 2016-08-10 NOTE — Assessment & Plan Note (Signed)
For some reason, sleep study was never scheduled CMA [place] will follow up with sleep center to be sure this gets scheduled, as she is now having daytime sleepiness.

## 2016-08-11 NOTE — Telephone Encounter (Signed)
Tried calling patient's cell phone. No answer, no voicemail.  FMLA forms completed, faxed and copied for scanning in patient's record.  Original copies placed up front for pick up.  Derl Barrow, RN

## 2016-08-12 NOTE — Telephone Encounter (Signed)
Tried calling patient again. No answer or voicemail.  FMLA forms placed up front for pick up.  Derl Barrow, RN

## 2016-08-14 ENCOUNTER — Ambulatory Visit (HOSPITAL_BASED_OUTPATIENT_CLINIC_OR_DEPARTMENT_OTHER): Payer: 59 | Attending: Family Medicine

## 2016-08-22 ENCOUNTER — Telehealth: Payer: Self-pay | Admitting: Family Medicine

## 2016-08-22 NOTE — Telephone Encounter (Signed)
Pt faxed over some paperwork for FMLA on Friday, it is in PCP's box. The papers need to be faxed back today. ep

## 2016-08-22 NOTE — Telephone Encounter (Signed)
I am just now seeing this message and it is already after hours. Attempted to call patient to assess urgency. She did not answer either  # I will deal with this first thing in the AM Leeanne Rio, MD

## 2016-08-22 NOTE — Telephone Encounter (Signed)
Pt is calling back to check the status of her FMLA paperwork. This has to be faxed today. Can we check the status and let patient know where we are in the progress. jw

## 2016-08-23 NOTE — Telephone Encounter (Signed)
Looked into this. Found original FMLA forms, I had left off the "duration of appointment" answer in question #7. Updated form. Will give to Tamika RN to take care of. Please inform patient this has been handled  Thanks Leeanne Rio, MD

## 2016-08-23 NOTE — Telephone Encounter (Signed)
Tried calling patient to inform her that information was corrected on her FMLA forms and faxed to [facility]; no answer or voicemail.  Forms copied for scanning in patient's record. Original form placed up front for pickup.  Derl Barrow, RN

## 2016-09-12 ENCOUNTER — Ambulatory Visit (INDEPENDENT_AMBULATORY_CARE_PROVIDER_SITE_OTHER): Payer: 59 | Admitting: Family Medicine

## 2016-09-12 VITALS — BP 142/98 | HR 98 | Temp 98.6°F | Wt 256.0 lb

## 2016-09-12 DIAGNOSIS — R05 Cough: Secondary | ICD-10-CM

## 2016-09-12 DIAGNOSIS — I10 Essential (primary) hypertension: Secondary | ICD-10-CM | POA: Diagnosis not present

## 2016-09-12 DIAGNOSIS — J4521 Mild intermittent asthma with (acute) exacerbation: Secondary | ICD-10-CM

## 2016-09-12 DIAGNOSIS — R059 Cough, unspecified: Secondary | ICD-10-CM

## 2016-09-12 MED ORDER — PREDNISONE 50 MG PO TABS: 50.0000 mg | ORAL_TABLET | Freq: Every day | ORAL | 0 refills | Status: DC

## 2016-09-12 MED ORDER — ALBUTEROL SULFATE (2.5 MG/3ML) 0.083% IN NEBU
2.5000 mg | INHALATION_SOLUTION | Freq: Once | RESPIRATORY_TRACT | Status: AC
  Administered 2016-09-12: 2.5 mg via RESPIRATORY_TRACT

## 2016-09-12 MED ORDER — IPRATROPIUM BROMIDE 0.02 % IN SOLN
0.5000 mg | Freq: Once | RESPIRATORY_TRACT | Status: AC
  Administered 2016-09-12: 0.5 mg via RESPIRATORY_TRACT

## 2016-09-12 NOTE — Progress Notes (Signed)
    Subjective: CC: cough HPI: Patient is a 56 y.o. female with a past medical history of allergic rhinitis, asthma, T2DM, Meniere's disease presenting to clinic today for cough.  Patient has had a cough for the last month. She was cleaning her old home that they moved out of and is worried about mold.  Cough is productive of clear/white thick mucous.  She notes mild SOB for the last week. She notes some wheezing. Saturday she started having chills and fatigue. She notes some shoulder pain but no diffuse myalgias or arthralgias.  She has been using albuterol inhaler which improves the cough for 6 hrs, then it gets worse. She has been using it every 6hrs. She hasn't been using Nasonex in 1 week, wasn't sure if it was helping.   Hasn't tried any other medications.   No rhinorrhea, facial pain, chest pain, watery eyes, sneezing, sore throat She endorses nasal congestion and chest congestion.  No issues like this previously, never hospitalized for asthma. She notes her triggers for exacerbations are dust and smoke  Social History: no smoke exposure    ROS: All other systems reviewed and are negative.  Past Medical History Patient Active Problem List   Diagnosis Date Noted  . Sleep difficulties 05/10/2016  . GERD (gastroesophageal reflux disease) 05/10/2016  . Asthma 05/10/2016  . Hyperlipidemia 05/09/2016  . Diabetes type 2, controlled ([place]) 09/13/2015  . Meniere's disease 09/16/2013  . Allergic rhinitis 05/30/2013  . Hypertension 04/24/2013    Medications- reviewed and updated  Objective: Office vital signs reviewed. BP (!) 142/98   Pulse 98   Temp 98.6 F (37 C) (Oral)   Wt 256 lb (116.1 kg)   LMP 09/06/2011 Comment: 5 years ago   SpO2 96%   BMI 45.35 kg/m    Physical Examination:  General: Awake, alert, well- nourished, NAD ENMT:  TMs intact, normal light reflex, no erythema, no bulging. Nasal turbinates moist. MMM, Oropharynx clear without erythema or  tonsillar exudate/hypertrophy Eyes: Conjunctiva non-injected. PERRL.  Cardio: RRR, no m/r/g noted.  Pulm: No increased WOB. Speaking in full sentences.  Diffuse inspiratory and expiratory wheeze. No rhonchi or crackles.  Assessment/Plan: Hypertension Mildly elevated, however pt just took BP medication. Advised her to f/u with PCP  Asthma Exam and history consistent with asthma exacerbation, I suspect from dust vs mold exposure while moving.   She had a Duoneb treatment in clinic with significant improvement in breathing and cough. Exam with improved air movement and expiratory wheeze after treatment. Pt notes more coughing but is happy as she's able to bring up sputum and notes she no longer feels as though it's hard to breath.  -Rx for prednisone 50mg  x 5 days, pt's DM is well controlled currently  - albuterol treatment q4hrs with q2hrs PRN. - discussed strict return precautions.  - pt to follow up in 2 days.    No orders of the defined types were placed in this encounter.   Meds ordered this encounter  Medications  . albuterol (PROVENTIL) (2.5 MG/3ML) 0.083% nebulizer solution 2.5 mg  . ipratropium (ATROVENT) nebulizer solution 0.5 mg  . predniSONE (DELTASONE) 50 MG tablet    Sig: Take 1 tablet (50 mg total) by mouth daily with breakfast.    Dispense:  5 tablet    Refill:  0    Archie Patten PGY-3, [place]

## 2016-09-12 NOTE — Assessment & Plan Note (Signed)
Mildly elevated, however pt just took BP medication. Advised her to f/u with PCP

## 2016-09-12 NOTE — Patient Instructions (Signed)
It was nice to meet you For your asthma exacerbation, start taking prednisone once daily with a meal for 5 days Start using the albuterol every 4 hours. If you know your breathing still not significant improved with every 4 hours scheduled, you can usually inhaler every 2 hours as needed in addition to that. If you note that your breathing is not improving please follow up with [place] within the next 2 days. If your breathing worsens or it fails to  improve with the albuterol treatment, please seek care.  You can use regular Mucinex, guaifenesin, to help loosen the phlegm.

## 2016-09-12 NOTE — Assessment & Plan Note (Addendum)
Exam and history consistent with asthma exacerbation, I suspect from dust vs mold exposure while moving.   She had a Duoneb treatment in clinic with significant improvement in breathing and cough. Exam with improved air movement and expiratory wheeze after treatment. Pt notes more coughing but is happy as she's able to bring up sputum and notes she no longer feels as though it's hard to breath.  -Rx for prednisone 50mg  x 5 days, pt's DM is well controlled currently  - albuterol treatment q4hrs with q2hrs PRN. - discussed strict return precautions.  - pt to follow up in 2 days.

## 2016-10-02 ENCOUNTER — Other Ambulatory Visit: Payer: Self-pay | Admitting: Family Medicine

## 2016-12-22 ENCOUNTER — Other Ambulatory Visit: Payer: Self-pay | Admitting: Family Medicine

## 2017-01-28 LAB — HM DIABETES EYE EXAM

## 2017-01-31 ENCOUNTER — Telehealth: Payer: Self-pay | Admitting: Family Medicine

## 2017-01-31 NOTE — Telephone Encounter (Signed)
I received FMLA paperwork faxed to be completed. I have completed it. Will return to [employer]. Please scan into chart & fax back.  Also please call patient & ask her to schedule follow up visit with me for her diabetes & blood pressure.  Thanks, Leeanne Rio, MD

## 2017-01-31 NOTE — Telephone Encounter (Signed)
Called patient to inform her that the FMLA forms are complete and faxed to [employer].  Original copy placed up front for pickup. Forms copied for scanning in patient's record.  Patient to schedule an appointment with PCP to discuss diabetes and blood pressure. No answer or voice mail.  Will try again later. Derl Barrow, RN

## 2017-02-01 NOTE — Telephone Encounter (Signed)
Tried calling patient again regarding FMLA. No answer or voicemail.  Derl Barrow, RN

## 2017-03-24 ENCOUNTER — Ambulatory Visit (HOSPITAL_COMMUNITY)
Admission: RE | Admit: 2017-03-24 | Discharge: 2017-03-24 | Disposition: A | Discharge status: Home / Self Care | Payer: 59 | Source: Ambulatory Visit | Attending: Family Medicine | Admitting: Family Medicine

## 2017-03-24 ENCOUNTER — Ambulatory Visit (INDEPENDENT_AMBULATORY_CARE_PROVIDER_SITE_OTHER): Payer: 59 | Admitting: Family Medicine

## 2017-03-24 VITALS — BP 130/80 | HR 111 | Temp 98.4°F | Ht 63.0 in | Wt 266.6 lb

## 2017-03-24 DIAGNOSIS — R232 Flushing: Secondary | ICD-10-CM | POA: Diagnosis not present

## 2017-03-24 DIAGNOSIS — R9431 Abnormal electrocardiogram [ECG] [EKG]: Secondary | ICD-10-CM

## 2017-03-24 DIAGNOSIS — M549 Dorsalgia, unspecified: Secondary | ICD-10-CM

## 2017-03-24 DIAGNOSIS — R5383 Other fatigue: Secondary | ICD-10-CM

## 2017-03-24 DIAGNOSIS — M722 Plantar fascial fibromatosis: Secondary | ICD-10-CM | POA: Diagnosis not present

## 2017-03-24 DIAGNOSIS — G479 Sleep disorder, unspecified: Secondary | ICD-10-CM | POA: Diagnosis not present

## 2017-03-24 DIAGNOSIS — E785 Hyperlipidemia, unspecified: Secondary | ICD-10-CM | POA: Insufficient documentation

## 2017-03-24 DIAGNOSIS — E119 Type 2 diabetes mellitus without complications: Secondary | ICD-10-CM

## 2017-03-24 DIAGNOSIS — R0683 Snoring: Secondary | ICD-10-CM | POA: Diagnosis not present

## 2017-03-24 DIAGNOSIS — Z23 Encounter for immunization: Secondary | ICD-10-CM | POA: Diagnosis not present

## 2017-03-24 DIAGNOSIS — Z1231 Encounter for screening mammogram for malignant neoplasm of breast: Secondary | ICD-10-CM | POA: Diagnosis not present

## 2017-03-24 DIAGNOSIS — R000 Tachycardia, unspecified: Secondary | ICD-10-CM

## 2017-03-24 DIAGNOSIS — Z1239 Encounter for other screening for malignant neoplasm of breast: Secondary | ICD-10-CM

## 2017-03-24 LAB — POCT URINALYSIS DIP (MANUAL ENTRY)
BILIRUBIN UA: NEGATIVE
BILIRUBIN UA: NEGATIVE mg/dL
Blood, UA: NEGATIVE
GLUCOSE UA: NEGATIVE mg/dL
LEUKOCYTES UA: NEGATIVE
NITRITE UA: NEGATIVE
Protein Ur, POC: NEGATIVE mg/dL
Spec Grav, UA: 1.015 (ref 1.010–1.025)
Urobilinogen, UA: 0.2 E.U./dL
pH, UA: 6 (ref 5.0–8.0)

## 2017-03-24 LAB — POCT GLYCOSYLATED HEMOGLOBIN (HGB A1C): Hemoglobin A1C: 6.9

## 2017-03-24 NOTE — Assessment & Plan Note (Signed)
Still has not yet had sleep study. It appears she no showed her last visit, I'm not sure that she ever was told about the appointment. Sleep study reordered.

## 2017-03-24 NOTE — Assessment & Plan Note (Signed)
Update lipids today

## 2017-03-24 NOTE — Progress Notes (Signed)
Date of Visit: 03/24/2017   HPI:  Patient presents for routine follow up, also to discuss:  Menopause - thinks she may be in menopause. No periods in 5-6 years as she previously had endometrial ablation. Having hot flashes. No vaginal bleeding. Also feels fatigued.  Heel pain - has had pain in R heel for some time. All throughout the day, worse in morning. Located on medial calcaneous. It's limiting her walking due to the pain.  Diabetes - taking glipizide 5 mg daily. Tolerating this well. No low sugars. Does have hot flashes, but when she has checked her sugars during these episodes it's been above 100. Recently got [facility] exam done at [facility].  Heart beating fast - for about a month has noticed her heart has been beating fast. Denies any chest pain or shortness of breath. No dizziness. She wonders if it is related to possible menopause.   Possible UTI - thinks she may have a UTI she's had a cramp in her lower abdomen and back before and after urinating. This is gone on since Labor Day weekend. No dysuria, fever, hesitancy. Does endorse frequency and urgency.  [place]: history of type 2 diabetes, hyperlipidemia, hypertension, menieres disease, asthma, allergic rhinitis  PHYSICAL EXAM: BP 130/80   Pulse (!) 111   Temp 98.4 F (36.9 C) (Oral)   Ht 5\' 3"  (1.6 m)   Wt 266 lb 9.6 oz (120.9 kg)   LMP 09/06/2011 Comment: 5 years ago   SpO2 95%   BMI 47.23 kg/m  Gen: no acute distress, pleasant, cooperative, well appearing HEENT: normocephalic, atraumatic, moist mucous membranes  Heart: regular rate and rhythm. No murmur Lungs: clear to auscultation bilaterally normal work of breathing  Neuro: alert, grossly nonfocal, speech normal Ext: No appreciable lower extremity edema bilaterally   ASSESSMENT/PLAN:  Health maintenance:  -encouraged to schedule colonoscopy, will mail handout to her on this as I believe she left without getting one. Has gotten handout previously  but not yet scheduled. -flu shot given today  -screening mammogram ordered, patient will be contacted by Breast center to schedule -normal diabetic foot exam today -will reach out to obtain eye exam records  Hot flashes Suspect she is going through menopause. No weight loss. We'll check FSH, CBC, vitamin D, TSH today as she also complains of fatigue.  Diabetes type 2, controlled ([place]) Remains well controlled with present A1c of 6.9.Continue present dose of glipizide. Check lipids today Will request records from eye visit at [facility]. Flu vaccine updated today. Normal foot exam today.  Sleep difficulties Still has not yet had sleep study. It appears she no showed her last visit, I'm not sure that she ever was told about the appointment. Sleep study reordered.  Hyperlipidemia Update lipids today.  Tachycardia Noted on exam today. EKG obtained as she also complains of occasional palpitations. EKG shows normal sinus rhythm with a ventricular rate of 93. EKG also shows possible prior septal infarct with Q waves in leads V1 and V2. Patient without any present or historical chest pain. These EKG findings may be insignificant, but to be on the safe side I will refer her to cardiology for further assessment. She is agreeable to this plan.  Plantar fasciitis Typical pain worse in the morning, worse with ambulation, located over medial calcaneus. I will mail her a copy of a handout on plantar fasciitis since I was not able to locate one during clinic today.  Urine symptoms - UA completely normal today. Benign abdominal  exam, no CVA tenderness. Did not have time to explore her back pain/abdl pain in more detail due to addressing so many other issues today. Doubt UTI with normal UA. Patient encouraged to schedule additional appointment to discuss this in more detail, along with her hand pain and shoulder pain (we didn't get to this issue today either). Patient agreeable & appreciative.  FOLLOW  UP: Schedule separate visit at next availability to discuss pain in more detail Follow up in 3 months for chronic medical issues Referring to cardiology  [place] J. Ardelia Mems, [place]

## 2017-03-24 NOTE — Assessment & Plan Note (Signed)
Remains well controlled with present A1c of 6.9.Continue present dose of glipizide. Check lipids today Will request records from eye visit at [facility]. Flu vaccine updated today. Normal foot exam today.

## 2017-03-24 NOTE — Assessment & Plan Note (Signed)
Noted on exam today. EKG obtained as she also complains of occasional palpitations. EKG shows normal sinus rhythm with a ventricular rate of 93. EKG also shows possible prior septal infarct with Q waves in leads V1 and V2. Patient without any present or historical chest pain. These EKG findings may be insignificant, but to be on the safe side I will refer her to cardiology for further assessment. She is agreeable to this plan.

## 2017-03-24 NOTE — Assessment & Plan Note (Signed)
Typical pain worse in the morning, worse with ambulation, located over medial calcaneus. I will mail her a copy of a handout on plantar fasciitis since I was not able to locate one during clinic today.

## 2017-03-24 NOTE — Patient Instructions (Addendum)
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 This is an important screening test for colon cancer.   See the handout on how to schedule your mammogram. This is an important test to screen for breast cancer.   Stay on glipizide. Keep working on weight loss.  Checking labwork today - menopause test, thyroid, cholesterol  Have re-ordered sleep study, someone will be in touch to schedule.  Referring to heart doctor.  Follow up with me in 3 months, sooner if needed.  Be well, Dr. Ardelia Mems

## 2017-03-24 NOTE — Assessment & Plan Note (Signed)
Suspect she is going through menopause. No weight loss. We'll check FSH, CBC, vitamin D, TSH today as she also complains of fatigue.

## 2017-03-25 LAB — CBC
HEMATOCRIT: 42.3 % (ref 34.0–46.6)
Hemoglobin: 14 g/dL (ref 11.1–15.9)
MCH: 28.1 pg (ref 26.6–33.0)
MCHC: 33.1 g/dL (ref 31.5–35.7)
MCV: 85 fL (ref 79–97)
PLATELETS: 317 10*3/uL (ref 150–379)
RBC: 4.99 x10E6/uL (ref 3.77–5.28)
RDW: 13.9 % (ref 12.3–15.4)
WBC: 7 10*3/uL (ref 3.4–10.8)

## 2017-03-25 LAB — LIPID PANEL
CHOL/HDL RATIO: 2.1 ratio (ref 0.0–4.4)
CHOLESTEROL TOTAL: 130 mg/dL (ref 100–199)
HDL: 62 mg/dL (ref >39)
LDL CALC: 47 mg/dL (ref 0–99)
TRIGLYCERIDES: 103 mg/dL (ref 0–149)
VLDL Cholesterol Cal: 21 mg/dL (ref 5–40)

## 2017-03-25 LAB — VITAMIN D 25 HYDROXY (VIT D DEFICIENCY, FRACTURES): VIT D 25 HYDROXY: 16.8 ng/mL — AB (ref 30.0–100.0)

## 2017-03-25 LAB — FOLLICLE STIMULATING HORMONE: FSH: 13.3 m[IU]/mL

## 2017-03-25 LAB — TSH: TSH: 2.16 u[IU]/mL (ref 0.450–4.500)

## 2017-03-31 ENCOUNTER — Telehealth: Payer: Self-pay | Admitting: Family Medicine

## 2017-03-31 NOTE — Telephone Encounter (Signed)
Attempted to reach patient to discuss labwork. No answer, no voicemail set up. Will try again next week.  Leeanne Rio, MD

## 2017-04-03 ENCOUNTER — Ambulatory Visit (INDEPENDENT_AMBULATORY_CARE_PROVIDER_SITE_OTHER): Payer: 59 | Admitting: Cardiology

## 2017-04-03 ENCOUNTER — Encounter: Payer: Self-pay | Admitting: Cardiology

## 2017-04-03 VITALS — BP 128/80 | HR 96 | Ht 63.0 in | Wt 270.0 lb

## 2017-04-03 DIAGNOSIS — E088 Diabetes mellitus due to underlying condition with unspecified complications: Secondary | ICD-10-CM

## 2017-04-03 DIAGNOSIS — E782 Mixed hyperlipidemia: Secondary | ICD-10-CM | POA: Diagnosis not present

## 2017-04-03 DIAGNOSIS — R0609 Other forms of dyspnea: Secondary | ICD-10-CM | POA: Insufficient documentation

## 2017-04-03 DIAGNOSIS — I10 Essential (primary) hypertension: Secondary | ICD-10-CM | POA: Diagnosis not present

## 2017-04-03 MED ORDER — ASPIRIN EC 81 MG PO TBEC: 162.0000 mg | DELAYED_RELEASE_TABLET | Freq: Every day | ORAL | 3 refills | Status: AC

## 2017-04-03 NOTE — Patient Instructions (Addendum)
Medication Instructions:  Your physician has recommended you make the following change in your medication:  START 162mg  of Aspirin daily  Labwork: None  Testing/Procedures: Your physician has requested that you have en exercise stress myoview. For further information please visit [email or web address]. Please follow instruction sheet, as given.  Your physician has requested that you have an echocardiogram. Echocardiography is a painless test that uses sound waves to create images of your heart. It provides your doctor with information about the size and shape of your heart and how well your heart's chambers and valves are working. This procedure takes approximately one hour. There are no restrictions for this procedure.  Your physician has recommended that you wear an event monitor. Event monitors are medical devices that record the heart's electrical activity. Doctors most often [place] these monitors to diagnose arrhythmias. Arrhythmias are problems with the speed or rhythm of the heartbeat. The monitor is a small, portable device. You can wear one while you do your normal daily activities. This is usually used to diagnose what is causing palpitations/syncope (passing out).    Follow-Up: 3 months  Any Other Special Instructions Will Be Listed Below (If Applicable).     If you need a refill on your cardiac medications before your next appointment, please call your pharmacy.  Echocardiogram An echocardiogram, or echocardiography, uses sound waves (ultrasound) to produce an image of your heart. The echocardiogram is simple, painless, obtained within a short period of time, and offers valuable information to your health care provider. The images from an echocardiogram can provide information such as:  Evidence of coronary artery disease (CAD).  Heart size.  Heart muscle function.  Heart valve function.  Aneurysm detection.  Evidence of a past heart attack.  Fluid buildup around the  heart.  Heart muscle thickening.  Assess heart valve function.  Tell a health care provider about:  Any allergies you have.  All medicines you are taking, including vitamins, herbs, eye drops, creams, and over-the-counter medicines.  Any problems you or family members have had with anesthetic medicines.  Any blood disorders you have.  Any surgeries you have had.  Any medical conditions you have.  Whether you are pregnant or may be pregnant. What happens before the procedure? No special preparation is needed. Eat and drink normally. What happens during the procedure?  In order to produce an image of your heart, gel will be applied to your chest and a wand-like tool (transducer) will be moved over your chest. The gel will help transmit the sound waves from the transducer. The sound waves will harmlessly bounce off your heart to allow the heart images to be captured in real-time motion. These images will then be recorded.  You may need an IV to receive a medicine that improves the quality of the pictures. What happens after the procedure? You may return to your normal schedule including diet, activities, and medicines, unless your health care provider tells you otherwise. This information is not intended to replace advice given to you by your health care provider. Make sure you discuss any questions you have with your health care provider. Document Released: 06/03/2000 Document Revised: 01/23/2016 Document Reviewed: 02/11/2013 Elsevier Interactive Patient Education  2017 [employer].   Cardiac Event Monitoring A cardiac event monitor is a small recording device that is used to detect abnormal heart rhythms (arrhythmias). The monitor is used to record your heart rhythm when you have symptoms, such as:  Fast heartbeats (palpitations), such as heart racing or fluttering.  Dizziness.  Fainting or light-headedness.  Unexplained weakness.  Some monitors are wired to electrodes  placed on your chest. Electrodes are flat, sticky disks that attach to your skin. Other monitors may be hand-held or worn on the wrist. The monitor can be worn for up to 30 days. If the monitor is attached to your chest, a technician will prepare your chest for the electrode placement and show you how to work the monitor. Take time to practice using the monitor before you leave the office. Make sure you understand how to send the information from the monitor to your health care provider. In some cases, you may need to use a landline telephone instead of a cell phone. What are the risks? Generally, this device is safe to use, but it possible that the skin under the electrodes will become irritated. How to use your cardiac event monitor  Wear your monitor at all times, except when you are in water: ? Do not let the monitor get wet. ? Take the monitor off when you bathe. Do not swim or use a hot tub with it on.  Keep your skin clean. Do not put body lotion or moisturizer on your chest.  Change the electrodes as told by your health care provider or any time they stop sticking to your skin. You may need to use medical tape to keep them on.  Try to put the electrodes in slightly different places on your chest to help prevent skin irritation. They must remain in the area under your left breast and in the upper right section of your chest.  Make sure the monitor is safely clipped to your clothing or in a location close to your body that your health care provider recommends.  Press the button to record as soon as you feel heart-related symptoms, such as: ? Dizziness. ? Weakness. ? Light-headedness. ? Palpitations. ? Thumping or pounding in your chest. ? Shortness of breath. ? Unexplained weakness.  Keep a diary of your activities, such as walking, doing chores, and taking medicine. It is very important to note what you were doing when you pushed the button to record your symptoms. This will help  your health care provider determine what might be contributing to your symptoms.  Send the recorded information as recommended by your health care provider. It may take some time for your health care provider to process the results.  Change the batteries as told by your health care provider.  Keep electronic devices away from your monitor. This includes: ? Tablets. ? MP3 players. ? Cell phones.  While wearing your monitor you should avoid: ? Electric blankets. ? Armed forces operational officer. ? Electric toothbrushes. ? Microwave ovens. ? Magnets. ? Metal detectors. Get help right away if:  You have chest pain.  You have extreme difficulty breathing or shortness of breath.  You develop a very fast heartbeat that persists.  You develop dizziness that does not go away.  You faint or constantly feel like you are about to faint. Summary  A cardiac event monitor is a small recording device that is used to help detect abnormal heart rhythms (arrhythmias).  The monitor is used to record your heart rhythm when you have heart-related symptoms.  Make sure you understand how to send the information from the monitor to your health care provider.  It is important to press the button on the monitor when you have any heart-related symptoms.  Keep a diary of your activities, such as walking, doing chores, and  taking medicine. It is very important to note what you were doing when you pushed the button to record your symptoms. This will help your health care provider learn what might be causing your symptoms. This information is not intended to replace advice given to you by your health care provider. Make sure you discuss any questions you have with your health care provider. Document Released: 03/15/2008 Document Revised: 05/21/2016 Document Reviewed: 05/21/2016 Elsevier Interactive Patient Education  2017 [employer] Liquid Diet A clear liquid diet means that you only have liquids that you  can see through. You do not eat any food on this diet. Most people need to follow this diet for only a short time. What do I need to know about this diet? A clear liquid is a liquid that you can see through when you hold it up to a light. This diet does not give you all the nutrients that you need. Choose a variety of the liquids that your doctor says you can drink on this diet. That way, you will get as many nutrients as possible. If you are not sure whether you can have certain items, ask your doctor. What can I have? Water and flavored water. Fruit juices that do not have pulp, such as cranberry juice and apple juice. Clear bouillon or broth. Broth-based soups that have been strained. Flavored gelatins. Honey. Sugar water. Frozen ice or frozen ice pops that do not have any milk, yogurt, fruit pieces, or fruit pulp in them. Clear sodas. Clear sports drinks. The items listed above may not be a complete list of recommended liquids. Contact your food and nutrition expert (dietitian) for more options. What can I not have? Juices that have pulp. Milk. Cream or cream-based soups. Yogurt. The items listed above may not be a complete list of liquids to avoid. Contact your food and nutrition expert for more information. Summary A clear liquid diet is a diet that includes only liquids that you can see through. The goal of this diet is to help you recover. Make sure to avoid liquids with milk, cream, or pulp while you are on this diet. This information is not intended to replace advice given to you by your health care provider. Make sure you discuss any questions you have with your health care provider. Document Released: 05/19/2008 Document Revised: 01/18/2016 Document Reviewed: 05/03/2013 Elsevier Interactive Patient Education  2017 [employer].

## 2017-04-03 NOTE — Progress Notes (Signed)
Cardiology Office Note:    Date:  04/03/2017   ID:  Annette Cabrera, DOB [birth year], MRN [id number]  PCP:  Leeanne Rio, MD  Cardiologist:  Jenean Lindau, MD   Referring MD: Leeanne Rio, MD    ASSESSMENT:    1. Essential hypertension   2. Mixed hyperlipidemia   3. Morbid obesity ([place])   4. 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 DOE (dyspnea on exertion)    PLAN:    In order of problems listed above:  1. Primary prevention stressed to the patient. Importance of compliance with diet and medications stressed and she verbalized understanding. She's had a TSH checked in the past. I reviewed her lab work. 2. In view of her significant symptoms she will have exercise stress Cardiolite. 3. Echocardiogram will be done to assess murmur heard on auscultation. 4.  To assess palpitations she'll have a 2 week event monitor. 5. Her blood pressure stable. Diet was discussed for dyslipidemia and diabetes mellitus and obesity. Risks of obesity explained and she verbalized understanding. She will be seen in follow-up appointment in 3 months or earlier if she has any concerns. 6. She knows to go to the nearest emergency room for any significant problems.   Medication Adjustments/Labs and Tests Ordered: Current medicines are reviewed at length with the patient today.  Concerns regarding medicines are outlined above.  No orders of the defined types were placed in this encounter.  No orders of the defined types were placed in this encounter.    History of Present Illness:    Annette Cabrera is a 56 y.o. female who is being seen today for the evaluation of palpitations and shortness of breath on exertion at the request of Ardelia Mems, Delorse Limber, MD. Patient is a pleasant 56 year old female.She is past medical history of essential hypertension, dyslipidemia, diabetes mellitus and morbid obesity. She  leads a sedentary lifestyle. She mentions to me that her major concern is of palpitations occurring on and off probably once a week or so. This is of concern to her. She does not have any chest pain orthopnea or PND. She tells me that she walks at work and also can walk a flight of stairs without any problems. She denies any chest tightness. At the time of my evaluation she is alert awake oriented and in no distress.  Past Medical History:  Diagnosis Date  . Asthma    as a child  . Hypertension   . Meniere's disease     Past Surgical History:  Procedure Laterality Date  . NOVASURE ABLATION    . TUBAL LIGATION      Current Medications: Current Meds  Medication Sig  . albuterol (PROVENTIL HFA;VENTOLIN HFA) 108 (90 Base) MCG/ACT inhaler Inhale 2 puffs into the lungs every 6 (six) hours as needed for wheezing or shortness of breath.  Marland Kitchen amLODipine (NORVASC) 10 MG tablet TAKE 1 TABLET BY MOUTH DAILY  . atorvastatin (LIPITOR) 40 MG tablet Take 1 tablet (40 mg total) by mouth daily.  . Blood Glucose Monitoring Suppl (ONE TOUCH ULTRA 2) w/Device KIT Check blood sugar as needed for symptoms of hypoglycemia.  . fexofenadine (ALLEGRA) 180 MG tablet Take 180 mg by mouth daily.  Marland Kitchen glipiZIDE (GLUCOTROL) 5 MG tablet TAKE 1 TABLET (5 MG TOTAL) BY MOUTH DAILY BEFORE BREAKFAST.  Marland Kitchen glucose blood (ONE TOUCH ULTRA TEST) test strip Check blood sugar as needed for symptoms of  hypoglycemia  . hydrochlorothiazide (HYDRODIURIL) 25 MG tablet TAKE 1 TABLET BY MOUTH DAILY  . KLOR-CON M20 20 MEQ tablet TAKE 2 TABLETS BY MOUTH EVERY DAY  . Lancets (ONETOUCH ULTRASOFT) lancets Check blood sugar as needed for symptoms of hypoglycemia  . lisinopril (PRINIVIL,ZESTRIL) 20 MG tablet TAKE 1 TABLET BY MOUTH DAILY  . mometasone (NASONEX) 50 MCG/ACT nasal spray Place 2 sprays into the nose daily.     Allergies:   Penicillins   Social History   Social History  . Marital status: Legally Separated    Spouse name: N/A  .  Number of children: N/A  . Years of education: N/A   Social History Main Topics  . Smoking status: Never Smoker  . Smokeless tobacco: Never Used  . Alcohol use No  . Drug use: No  . Sexual activity: Not Asked   Other Topics Concern  . None   Social History Narrative  . None     Family History: The patient's family history includes Diabetes in her brother, father, and mother; Fibroids in her sister; Heart disease in her father; Lung cancer (age of onset: 81) in her father.  ROS:   Please see the history of present illness.    All other systems reviewed and are negative.  EKGs/Labs/Other Studies Reviewed:    The following studies were reviewed today: I reviewed records from primary care physician including lab work and EKG does extensive length   Recent Labs: 08/09/2016: ALT 18; BUN 9; Creat 0.66; Potassium 3.9; Sodium 140 03/24/2017: Hemoglobin 14.0; Platelets 317; TSH 2.160  Recent Lipid Panel    Component Value Date/Time   CHOL 130 03/24/2017 1118   TRIG 103 03/24/2017 1118   HDL 62 03/24/2017 1118   CHOLHDL 2.1 03/24/2017 1118   CHOLHDL 3.2 03/07/2016 1118   VLDL 30 03/07/2016 1118   LDLCALC 47 03/24/2017 1118    Physical Exam:    VS:  BP 128/80   Pulse 96   Ht 5' 3" (1.6 m)   Wt 270 lb (122.5 kg)   LMP 09/06/2011 Comment: 5 years ago   SpO2 97%   BMI 47.83 kg/m     Wt Readings from Last 3 Encounters:  04/03/17 270 lb (122.5 kg)  03/24/17 266 lb 9.6 oz (120.9 kg)  09/12/16 256 lb (116.1 kg)     GEN: Patient is in no acute distress HEENT: Normal NECK: No JVD; No carotid bruits LYMPHATICS: No lymphadenopathy CARDIAC: S1 S2 regular, 2/6 systolic murmur at the apex. RESPIRATORY:  Clear to auscultation without rales, wheezing or rhonchi  ABDOMEN: Soft, non-tender, non-distended MUSCULOSKELETAL:  No edema; No deformity  SKIN: Warm and dry NEUROLOGIC:  Alert and oriented x 3 PSYCHIATRIC:  Normal affect    Signed, Rajan R Revankar, MD  04/03/2017  3:49 PM    [facility]   

## 2017-04-03 NOTE — Addendum Note (Signed)
Addended by: Mattie Marlin on: 04/03/2017 04:09 PM   Modules accepted: Orders

## 2017-04-04 ENCOUNTER — Telehealth (HOSPITAL_COMMUNITY): Payer: Self-pay | Admitting: *Deleted

## 2017-04-04 NOTE — Telephone Encounter (Signed)
Attempted to call regarding upcoming nuclear appointment- no answer, unable to leave message.  [name]

## 2017-04-10 ENCOUNTER — Other Ambulatory Visit: Payer: Self-pay | Admitting: Family Medicine

## 2017-04-10 ENCOUNTER — Ambulatory Visit (HOSPITAL_COMMUNITY): Payer: 59 | Attending: Cardiology

## 2017-04-10 DIAGNOSIS — R0609 Other forms of dyspnea: Secondary | ICD-10-CM | POA: Diagnosis not present

## 2017-04-10 MED ORDER — TECHNETIUM TC 99M TETROFOSMIN IV KIT
32.8000 | PACK | Freq: Once | INTRAVENOUS | Status: AC | PRN
  Administered 2017-04-10: 32.8 via INTRAVENOUS
  Filled 2017-04-10: qty 33

## 2017-04-10 NOTE — Telephone Encounter (Signed)
Pt came in office asking if doctor can call her regarding to follow test and appointment, and the heart doctor visit. [phone].

## 2017-04-11 ENCOUNTER — Ambulatory Visit (HOSPITAL_COMMUNITY): Payer: 59 | Attending: Cardiology

## 2017-04-11 LAB — MYOCARDIAL PERFUSION IMAGING
CHL CUP MPHR: 165 {beats}/min
CHL CUP NUCLEAR SDS: 3
CHL CUP RESTING HR STRESS: 90 {beats}/min
Estimated workload: 4.6 METS
Exercise duration (min): 4 min
Exercise duration (sec): 0 s
LV sys vol: 40 mL
LVDIAVOL: 93 mL (ref 46–106)
Peak HR: 148 {beats}/min
Percent HR: 89 %
RATE: 0.31
SRS: 6
SSS: 9
TID: 0.9

## 2017-04-11 MED ORDER — TECHNETIUM TC 99M TETROFOSMIN IV KIT
33.0000 | PACK | Freq: Once | INTRAVENOUS | Status: AC | PRN
  Administered 2017-04-11: 33 via INTRAVENOUS
  Filled 2017-04-11: qty 33

## 2017-04-11 MED ORDER — AMLODIPINE BESYLATE 10 MG PO TABS: 10.0000 mg | ORAL_TABLET | Freq: Every day | ORAL | 3 refills | Status: DC

## 2017-04-11 MED ORDER — CHOLECALCIFEROL 1.25 MG (50000 UT) PO TABS
1.0000 | ORAL_TABLET | ORAL | 0 refills | Status: DC
Start: 2017-04-11 — End: 2017-07-07

## 2017-04-11 MED ORDER — HYDROCHLOROTHIAZIDE 25 MG PO TABS: 25.0000 mg | ORAL_TABLET | Freq: Every day | ORAL | 3 refills | Status: DC

## 2017-04-11 NOTE — Telephone Encounter (Signed)
Called patient and spoke with her Reviewed lab results  [place] not in menopausal range yet, suspect she is in perimenopause. Denies night sweats Discussed low Vit D - recommend weekly 50000 units for 8 weeks then recheck  Appointment scheduled for her to follow up with me on 10/31 at 2p - working her in outside my regular clinic time.  Patient appreciative.  Leeanne Rio, MD

## 2017-04-19 ENCOUNTER — Ambulatory Visit (INDEPENDENT_AMBULATORY_CARE_PROVIDER_SITE_OTHER): Payer: 59 | Admitting: Family Medicine

## 2017-04-19 ENCOUNTER — Encounter: Payer: Self-pay | Admitting: Family Medicine

## 2017-04-19 DIAGNOSIS — M25511 Pain in right shoulder: Secondary | ICD-10-CM

## 2017-04-19 DIAGNOSIS — E559 Vitamin D deficiency, unspecified: Secondary | ICD-10-CM | POA: Diagnosis not present

## 2017-04-19 DIAGNOSIS — J309 Allergic rhinitis, unspecified: Secondary | ICD-10-CM | POA: Diagnosis not present

## 2017-04-19 DIAGNOSIS — M25512 Pain in left shoulder: Secondary | ICD-10-CM | POA: Diagnosis not present

## 2017-04-19 DIAGNOSIS — G8929 Other chronic pain: Secondary | ICD-10-CM

## 2017-04-19 NOTE — Progress Notes (Signed)
Date of Visit: 04/19/2017   HPI:  Patient presents for follow up. We worked her in outside of my regular clinic since I had no openings.  Shoulder pain - has pain in both shoulders. Not occurring everyday but is bothersome when it does happen. Worse on cold days.  Occurs on both sides and is worse with heavy lifting or strenuous activity.  She is right-handed.  Also has some pain in her right wrist and hand.  Has used Biofreeze with some improvement.  Also has tried Aleve, but is not sure if it helps as it makes her fall asleep.  Vitamin D deficiency: Has not yet picked up vitamin D from her pharmacy.  Plans to get it soon.  Health maintenance: Due for mammogram and colonoscopy.  Needs handouts for both of these.  Sinus congestion: Currently taking Allegra daily as well as Nasonex daily.  Still having a lot of nasal congestion.  In the past and have examined her nose her turbinates seem swollen.  She has a history of Mnire's disease but has not been seen by ENT in several years.  She is agreeable to calling for an appointment at the same office.  Other updates: Has seen cardiology and is undergoing workup for palpitations.  Also has upcoming sleep study scheduled.  ROS: See HPI.  [place]: History of type 2 diabetes, hyperlipidemia, hypertension, Mnire's disease, morbid obesity, asthma, allergic rhinitis   PHYSICAL EXAM: BP 110/70   Pulse 96   Temp 98.6 F (37 C) (Oral)   Ht 5\' 3"  (1.6 m)   Wt 267 lb (121.1 kg)   LMP 09/06/2011 Comment: 5 years ago   SpO2 96%   BMI 47.30 kg/m  Gen: no acute distress, pleasant, cooperative HEENT: Normocephalic, atraumatic, TMs clear bilaterally, oropharynx clear, turbinates boggy bilaterally Heart: Regular rate and rhythm, no murmur Lungs: Clear to auscultation bilaterally, normal effort Neuro: Alert, grossly nonfocal, speech normal Ext: Full active and passive range of motion of shoulders.  Positive empty can test and faintly positive Neer's.   Grip strength 5 out of 5 bilaterally.  Right wrist mildly tender to palpation without swelling, warmth, or deformity.  ASSESSMENT/PLAN:  Health maintenance:  -Handout given on colonoscopy and mammogram, patient encouraged to schedule each of these.   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  Discussed options for x-rays with patient versus trial of empiric exercises, versus shoulder injections.  She prefers exercises.  Given handout.  Encouraged her to take Tylenol as needed for pain.  Vitamin D deficiency We will need to recheck a level after patient completes course of repletion therapy.  Allergic rhinitis Currently with significant congestion symptoms.  We will have her schedule with ENT to be seen for this.  She may benefit from rhinoscopy to evaluate turbinates more closely.  They have been consistently boggy and enlarged on prior exams.  FOLLOW UP: Follow up in 3 months for chronic medical issues  [place] J. Ardelia Mems, [place]

## 2017-04-19 NOTE — Patient Instructions (Addendum)
See handout below for exercises for your shoulders  See the handout on how to schedule your mammogram. This is an important test to screen for breast cancer.  See the handout on how to schedule your colonoscopy. This is an important screening test for colon cancer.   Call the ENT office for an appointment:  [phone]  Follow up with me in 3 months, sooner if needed.  Be well, Dr. Ardelia Mems    Shoulder Impingement Syndrome Rehab Ask your health care provider which exercises are safe for you. Do exercises exactly as told by your health care provider and adjust them as directed. It is normal to feel mild stretching, pulling, tightness, or discomfort as you do these exercises, but you should stop right away if you feel sudden pain or your pain gets worse.Do not begin these exercises until told by your health care provider. Stretching and range of motion exercise This exercise warms up your muscles and joints and improves the movement and flexibility of your shoulder. This exercise also helps to relieve pain and stiffness. Exercise A: Passive horizontal adduction  1. Sit or stand and pull your left / right elbow across your chest, toward your other shoulder. Stop when you feel a gentle stretch in the back of your shoulder and upper arm. ? Keep your arm at shoulder height. ? Keep your arm as close to your body as you comfortably can. 2. Hold for __________ seconds. 3. Slowly return to the starting position. Repeat __________ times. Complete this exercise __________ times a day. Strengthening exercises These exercises build strength and endurance in your shoulder. Endurance is the ability to use your muscles for a long time, even after they get tired. Exercise B: External rotation, isometric 1. Stand or sit in a doorway, facing the door frame. 2. Bend your left / right elbow and place the back of your wrist against the door frame. Only your wrist should be touching the frame. Keep your  upper arm at your side. 3. Gently press your wrist against the door frame, as if you are trying to push your arm away from your abdomen. ? Avoid shrugging your shoulder while you press your hand against the door frame. Keep your shoulder blade tucked down toward the middle of your back. 4. Hold for __________ seconds. 5. Slowly release the tension, and relax your muscles completely before you do the exercise again. Repeat __________ times. Complete this exercise __________ times a day. Exercise C: Internal rotation, isometric  1. Stand or sit in a doorway, facing the door frame. 2. Bend your left / right elbow and place the inside of your wrist against the door frame. Only your wrist should be touching the frame. Keep your upper arm at your side. 3. Gently press your wrist against the door frame, as if you are trying to push your arm toward your abdomen. ? Avoid shrugging your shoulder while you press your hand against the door frame. Keep your shoulder blade tucked down toward the middle of your back. 4. Hold for __________ seconds. 5. Slowly release the tension, and relax your muscles completely before you do the exercise again. Repeat __________ times. Complete this exercise __________ times a day. Exercise D: Scapular protraction, supine  1. Lie on your back on a firm surface. Hold a __________ weight in your left / right hand. 2. Raise your left / right arm straight into the air so your hand is directly above your shoulder joint. 3. Push the weight into  the air so your shoulder lifts off of the surface that you are lying on. Do not move your head, neck, or back. 4. Hold for __________ seconds. 5. Slowly return to the starting position. Let your muscles relax completely before you repeat this exercise. Repeat __________ times. Complete this exercise __________ times a day. Exercise E: Scapular retraction  1. Sit in a stable chair without armrests, or stand. 2. Secure an exercise band to  a stable object in front of you so the band is at shoulder height. 3. Hold one end of the exercise band in each hand. Your palms should face down. 4. Squeeze your shoulder blades together and move your elbows slightly behind you. Do not shrug your shoulders while you do this. 5. Hold for __________ seconds. 6. Slowly return to the starting position. Repeat __________ times. Complete this exercise __________ times a day. Exercise F: Shoulder extension  1. Sit in a stable chair without armrests, or stand. 2. Secure an exercise band to a stable object in front of you where the band is above shoulder height. 3. Hold one end of the exercise band in each hand. 4. Straighten your elbows and lift your hands up to shoulder height. 5. Squeeze your shoulder blades together and pull your hands down to the sides of your thighs. Stop when your hands are straight down by your sides. Do not let your hands go behind your body. 6. Hold for __________ seconds. 7. Slowly return to the starting position. Repeat __________ times. Complete this exercise __________ times a day. This information is not intended to replace advice given to you by your health care provider. Make sure you discuss any questions you have with your health care provider. Document Released: 06/06/2005 Document Revised: 02/11/2016 Document Reviewed: 05/09/2015 Elsevier Interactive Patient Education  [employer].

## 2017-04-21 DIAGNOSIS — M25519 Pain in unspecified shoulder: Secondary | ICD-10-CM | POA: Insufficient documentation

## 2017-04-21 DIAGNOSIS — E559 Vitamin D deficiency, unspecified: Secondary | ICD-10-CM | POA: Insufficient documentation

## 2017-04-21 NOTE — Assessment & Plan Note (Signed)
Currently with significant congestion symptoms.  We will have her schedule with ENT to be seen for this.  She may benefit from rhinoscopy to evaluate turbinates more closely.  They have been consistently boggy and enlarged on prior exams.

## 2017-04-21 NOTE — Assessment & Plan Note (Addendum)
Exam consistent with likely rotator cuff impingement syndrome.  Discussed options for x-rays with patient versus trial of empiric exercises, versus shoulder injections.  She prefers exercises.  Given handout.  Encouraged her to take Tylenol as needed for pain.

## 2017-04-21 NOTE — Assessment & Plan Note (Signed)
We will need to recheck a level after patient completes course of repletion therapy.

## 2017-04-29 ENCOUNTER — Ambulatory Visit (HOSPITAL_BASED_OUTPATIENT_CLINIC_OR_DEPARTMENT_OTHER): Payer: 59 | Attending: Family Medicine | Admitting: Internal Medicine

## 2017-04-29 VITALS — Ht 63.0 in | Wt 264.0 lb

## 2017-04-29 DIAGNOSIS — R0683 Snoring: Secondary | ICD-10-CM

## 2017-04-29 DIAGNOSIS — G4733 Obstructive sleep apnea (adult) (pediatric): Secondary | ICD-10-CM | POA: Diagnosis not present

## 2017-04-29 DIAGNOSIS — Z79899 Other long term (current) drug therapy: Secondary | ICD-10-CM | POA: Diagnosis not present

## 2017-05-01 ENCOUNTER — Encounter: Payer: Self-pay | Admitting: Family Medicine

## 2017-05-01 ENCOUNTER — Other Ambulatory Visit: Payer: Self-pay | Admitting: Family Medicine

## 2017-05-03 ENCOUNTER — Ambulatory Visit: Payer: 59

## 2017-05-03 ENCOUNTER — Ambulatory Visit (HOSPITAL_BASED_OUTPATIENT_CLINIC_OR_DEPARTMENT_OTHER)
Admission: RE | Admit: 2017-05-03 | Discharge: 2017-05-03 | Disposition: A | Discharge status: Home / Self Care | Payer: 59 | Source: Ambulatory Visit | Attending: Family Medicine | Admitting: Family Medicine

## 2017-05-03 ENCOUNTER — Ambulatory Visit (HOSPITAL_BASED_OUTPATIENT_CLINIC_OR_DEPARTMENT_OTHER)
Admission: RE | Admit: 2017-05-03 | Discharge: 2017-05-03 | Disposition: A | Discharge status: Home / Self Care | Payer: 59 | Source: Ambulatory Visit | Attending: Cardiology | Admitting: Cardiology

## 2017-05-03 DIAGNOSIS — Z1231 Encounter for screening mammogram for malignant neoplasm of breast: Secondary | ICD-10-CM | POA: Diagnosis present

## 2017-05-03 DIAGNOSIS — Z1239 Encounter for other screening for malignant neoplasm of breast: Secondary | ICD-10-CM

## 2017-05-03 DIAGNOSIS — I340 Nonrheumatic mitral (valve) insufficiency: Secondary | ICD-10-CM | POA: Diagnosis not present

## 2017-05-03 DIAGNOSIS — R0609 Other forms of dyspnea: Secondary | ICD-10-CM | POA: Insufficient documentation

## 2017-05-03 NOTE — Progress Notes (Signed)
  Echocardiogram 2D Echocardiogram has been performed.  Annette Cabrera T Stanly Si 05/03/2017, 8:58 AM

## 2017-05-04 ENCOUNTER — Telehealth: Payer: Self-pay | Admitting: Family Medicine

## 2017-05-04 NOTE — Telephone Encounter (Signed)
Received request for me to complete FMLA paperwork (patient faxed it to me) Forms have been completed. Will return to RN office.  RN team, please do the following: - call & get patient's permission for [place] to send in FMLA forms directly to [place] (her employer) - scan copy of forms into chart - fax to [place] once patient's permission obtained  Thanks! Leeanne Rio, MD

## 2017-05-07 DIAGNOSIS — G4733 Obstructive sleep apnea (adult) (pediatric): Secondary | ICD-10-CM | POA: Diagnosis not present

## 2017-05-07 NOTE — Procedures (Signed)
   Patient Name: Annette Cabrera, Rimmer Date: 04/29/2017 Gender: Female D.O.B: 09-04-1960 Age (years): 56 Referring Provider: Leeanne Rio Height (inches): 63 Interpreting Physician: Baird Lyons MD, ABSM Weight (lbs): 264 RPSGT: Earney [place] BMI: 47 MRN: [id number] Neck Size: 15.50 CLINICAL INFORMATION Sleep Study Type: NPSG  Indication for sleep study: Snoring  Epworth Sleepiness Score: 9  SLEEP STUDY TECHNIQUE As per the AASM Manual for the Scoring of Sleep and Associated Events v2.3 (April 2016) with a hypopnea requiring 4% desaturations.  The channels recorded and monitored were frontal, central and occipital EEG, electrooculogram (EOG), submentalis EMG (chin), nasal and oral airflow, thoracic and abdominal wall motion, anterior tibialis EMG, snore microphone, electrocardiogram, and pulse oximetry.  MEDICATIONS Medications self-administered by patient taken the night of the study : ALLEGRA, [place] The study was initiated at 9:34:43 PM and ended at 4:37:25 AM.  Sleep onset time was 9.8 minutes and the sleep efficiency was 90.8%. The total sleep time was 383.9 minutes.  Stage REM latency was 56.0 minutes.  The patient spent 1.04% of the night in stage N1 sleep, 78.25% in stage N2 sleep, 0.00% in stage N3 and 20.71% in REM.  Alpha intrusion was absent.  Supine sleep was 75.52%.  RESPIRATORY PARAMETERS The overall apnea/hypopnea index (AHI) was 15.0 per hour. There were 45 total apneas, including 45 obstructive, 0 central and 0 mixed apneas. There were 51 hypopneas and 12 RERAs.  The AHI during Stage REM sleep was 53.6 per hour.  AHI while supine was 16.1 per hour.  The mean oxygen saturation was 91.73%. The minimum SpO2 during sleep was 72.00%.  loud snoring was noted during this study.  CARDIAC DATA The 2 lead EKG demonstrated sinus rhythm. The mean heart rate was 81.95 beats per minute. Other EKG findings include: None.  LEG  MOVEMENT DATA The total PLMS were 0 with a resulting PLMS index of 0.00. Associated arousal with leg movement index was 0.0 .  IMPRESSIONS - Mild to moderate obstructive sleep apnea occurred during this study (AHI = 15.0/h). - No significant central sleep apnea occurred during this study (CAI = 0.0/h). - Moderate oxygen desaturation was noted during this study (Min O2 = 72.00%, Mean 91.7%). - The patient snored with loud snoring volume. - No cardiac abnormalities were noted during this study. - Clinically significant periodic limb movements did not occur during sleep. No significant associated arousals.  DIAGNOSIS - Obstructive Sleep Apnea (327.23 [G47.33 ICD-10])  RECOMMENDATIONS - Therapeutic CPAP titration or autoPAP to determine optimal pressure required to alleviate sleep disordered breathing. - Avoid alcohol, sedatives and other CNS depressants that may worsen sleep apnea and disrupt normal sleep architecture. - Sleep hygiene should be reviewed to assess factors that may improve sleep quality. - Weight management and regular exercise should be initiated or continued if appropriate.  [Electronically signed] 05/07/2017 02:45 PM  Baird Lyons MD, [place], American Board of Sleep Medicine   NPI: [id number]                          [place], [place] [facility]  ELECTRONICALLY SIGNED ON:  05/07/2017, 2:43 PM [place] PH: [phone]   FX: [phone] [place]

## 2017-05-08 NOTE — Telephone Encounter (Signed)
Called patient and obtained permission to fax FMLA paperwork to [place] at [phone]. Fax sent. Copy placed in scan box and original placed up front for patient to pick up. Hubbard Hartshorn, RN, BSN

## 2017-05-10 ENCOUNTER — Telehealth: Payer: Self-pay | Admitting: Family Medicine

## 2017-05-10 DIAGNOSIS — G4733 Obstructive sleep apnea (adult) (pediatric): Secondary | ICD-10-CM

## 2017-05-10 NOTE — Telephone Encounter (Signed)
Called patient to discuss results of sleep study which showed mild to moderate OSA. She did not like the sleep study experience and wants to avoid returning to the sleep center for a cpap titration if possible. Discussed avoidance of sedating medications given proven airway obstruction while sleeping.  We will try autoPAP. Order entered.  Lau[place], can you help get her set up for this?  Thanks! Leeanne Rio, MD

## 2017-05-15 ENCOUNTER — Other Ambulatory Visit: Payer: Self-pay | Admitting: Family Medicine

## 2017-05-15 DIAGNOSIS — G4733 Obstructive sleep apnea (adult) (pediatric): Secondary | ICD-10-CM

## 2017-05-15 NOTE — Telephone Encounter (Signed)
Darlina Guys at [facility] notified that order has been placed for CPAP autotitrate. Hubbard Hartshorn, RN, BSN

## 2017-05-23 ENCOUNTER — Telehealth: Payer: Self-pay

## 2017-05-23 NOTE — Telephone Encounter (Signed)
Informed patient about her holter monitor results. Patient stated that she was given a CPAP due to her new diagnosis of sleep apnea.

## 2017-07-07 ENCOUNTER — Other Ambulatory Visit: Payer: Self-pay

## 2017-07-07 ENCOUNTER — Ambulatory Visit (INDEPENDENT_AMBULATORY_CARE_PROVIDER_SITE_OTHER): Payer: 59 | Admitting: Family Medicine

## 2017-07-07 ENCOUNTER — Encounter: Payer: Self-pay | Admitting: Family Medicine

## 2017-07-07 VITALS — BP 128/82 | HR 100 | Temp 98.1°F | Wt 270.0 lb

## 2017-07-07 DIAGNOSIS — I10 Essential (primary) hypertension: Secondary | ICD-10-CM

## 2017-07-07 DIAGNOSIS — G4733 Obstructive sleep apnea (adult) (pediatric): Secondary | ICD-10-CM | POA: Diagnosis not present

## 2017-07-07 DIAGNOSIS — E119 Type 2 diabetes mellitus without complications: Secondary | ICD-10-CM | POA: Diagnosis not present

## 2017-07-07 DIAGNOSIS — E559 Vitamin D deficiency, unspecified: Secondary | ICD-10-CM

## 2017-07-07 LAB — POCT GLYCOSYLATED HEMOGLOBIN (HGB A1C): HEMOGLOBIN A1C: 7.1

## 2017-07-07 MED ORDER — CHOLECALCIFEROL 1.25 MG (50000 UT) PO TABS: 1.0000 | ORAL_TABLET | ORAL | 0 refills | Status: DC

## 2017-07-07 NOTE — Assessment & Plan Note (Signed)
Well-controlled.  Continue current regimen. 

## 2017-07-07 NOTE — Progress Notes (Signed)
Date of Visit: 07/07/2017   HPI:  Patient presents for routine follow up.  Diabetes - taking glipizide 5mg  daily. Had some dietary indiscretions over the holidays and is up 3lb. Now working on eating better. A1c today 7.1, up from 6.9 at last check. Denies chest pain or shortness of breath.  Hypertension - taking amlodipine 10mg  daily, HCTZ 25mg  daily, and lisinopril 20mg  daily. Tolerating these well.  OSA - now has CPAP. Uses consistently and is sleeping better. Feels better during the day as well. Missed only 2 nights in the last month, due to having to sleep on the couch because there was mold under her bed.  Vit D low - has not yet picked up vitamin D rx. There was an issue at the pharmacy. Needs this resent to her pharmacy.  ROS: See HPI.  [place]: history of type 2 diabetes, hyperlipidemia, hypertension, menieres, OSA, vit D deficiency, obesity, GERD  PHYSICAL EXAM: BP 128/82   Pulse 100   Temp 98.1 F (36.7 C) (Oral)   Wt 270 lb (122.5 kg)   LMP 09/06/2011 Comment: 5 years ago   SpO2 99%   BMI 47.83 kg/m  Gen: no acute distress, pleasant, cooperative, well appearing HEENT: normocephalic, atraumatic  Heart: regular rate and rhythm, no murmur Lungs: clear to auscultation bilaterally, normal work of breathing  Neuro: alert, grossly nonfocal, speech normal Ext: No appreciable lower extremity edema bilaterally   ASSESSMENT/PLAN:  Health maintenance:  -given another handout on how to schedule colonoscopy.  -otherwise UTD on HM items  Hypertension Well controlled. Continue current regimen.   Diabetes type 2, controlled ([place]) Adequate control with A1c 7.1. Continue glipizide. Working on lifestyle measures - exercise, healthy eating. Reviewed healthy eating during today's visit. Follow up in 3 months.   Vitamin D deficiency Resent rx to her pharmacy for Vit D supplementation. Recheck level at next office visit.   OSA (obstructive sleep apnea) Now doing very well on  CPAP. Continue nightly CPAP.   FOLLOW UP: Follow up in 3 mos for above issues  [place] J. Ardelia Mems, [place]

## 2017-07-07 NOTE — Assessment & Plan Note (Signed)
Adequate control with A1c 7.1. Continue glipizide. Working on lifestyle measures - exercise, healthy eating. Reviewed healthy eating during today's visit. Follow up in 3 months.

## 2017-07-07 NOTE — Patient Instructions (Signed)
It was great to see you again today!  Continue current medications Work on diet & weight management, exercise  Continue CPAP. Good job with using this.  See the handout on how to schedule your colonoscopy. This is an important screening test for colon cancer.   Pick up vit D. We'll recheck next time I see you.  Follow up in 3 months   Be well, Dr. Ardelia Mems

## 2017-07-07 NOTE — Assessment & Plan Note (Signed)
Resent rx to her pharmacy for Vit D supplementation. Recheck level at next office visit.

## 2017-07-07 NOTE — Assessment & Plan Note (Signed)
Now doing very well on CPAP. Continue nightly CPAP.

## 2017-07-19 ENCOUNTER — Ambulatory Visit: Payer: 59 | Admitting: Student in an Organized Health Care Education/Training Program

## 2017-07-21 ENCOUNTER — Other Ambulatory Visit: Payer: Self-pay

## 2017-07-21 ENCOUNTER — Ambulatory Visit (INDEPENDENT_AMBULATORY_CARE_PROVIDER_SITE_OTHER): Payer: 59 | Admitting: Family Medicine

## 2017-07-21 ENCOUNTER — Encounter: Payer: Self-pay | Admitting: Family Medicine

## 2017-07-21 VITALS — BP 126/72 | HR 98 | Temp 98.3°F | Ht 63.0 in | Wt 269.8 lb

## 2017-07-21 DIAGNOSIS — J4521 Mild intermittent asthma with (acute) exacerbation: Secondary | ICD-10-CM | POA: Diagnosis not present

## 2017-07-21 MED ORDER — ALBUTEROL SULFATE (2.5 MG/3ML) 0.083% IN NEBU
2.5000 mg | INHALATION_SOLUTION | Freq: Once | RESPIRATORY_TRACT | Status: AC
  Administered 2017-07-21: 2.5 mg via RESPIRATORY_TRACT

## 2017-07-21 MED ORDER — DOXYCYCLINE HYCLATE 100 MG PO TABS: 100.0000 mg | ORAL_TABLET | Freq: Two times a day (BID) | ORAL | 0 refills | Status: DC

## 2017-07-21 MED ORDER — IPRATROPIUM BROMIDE 0.02 % IN SOLN
0.5000 mg | Freq: Once | RESPIRATORY_TRACT | Status: AC
  Administered 2017-07-21: 0.5 mg via RESPIRATORY_TRACT

## 2017-07-21 MED ORDER — PREDNISONE 10 MG PO TABS
ORAL_TABLET | ORAL | 0 refills | Status: DC
Start: 2017-07-21 — End: 2017-09-01

## 2017-07-21 NOTE — Progress Notes (Signed)
Subjective:    Annette Cabrera is a 57 y.o. female who presents to [facility] today for cough and asthma sx's:  1.  Cough:  Present for about 2 weeks.  Started right after she was seen here for chronic medical issues.  Initially with URI symptoms.  These have gradually abated but began having cough around that time as well.  Her cough has continued.  She has been using her albuterol multiple times a day but the cough is still worse at night when she goes outside.  It is nonproductive.  It is dry and hacking.  No fevers or chills.  No further URI symptoms.  She is eating and drinking well and otherwise feels well.   ROS as above per HPI.    The following portions of the patient's history were reviewed and updated as appropriate: allergies, current medications, past medical history, family and social history, and problem list. Patient is a nonsmoker.    PMH reviewed.  Past Medical History:  Diagnosis Date  . Asthma    as a child  . Hypertension   . Meniere's disease    Past Surgical History:  Procedure Laterality Date  . NOVASURE ABLATION    . TUBAL LIGATION      Medications reviewed. Current Outpatient Medications  Medication Sig Dispense Refill  . albuterol (PROVENTIL HFA;VENTOLIN HFA) 108 (90 Base) MCG/ACT inhaler Inhale 2 puffs into the lungs every 6 (six) hours as needed for wheezing or shortness of breath. 18 g 3  . amLODipine (NORVASC) 10 MG tablet TAKE 1 TABLET EVERY DAY 90 tablet 2  . aspirin EC 81 MG tablet Take 2 tablets (162 mg total) by mouth daily. 90 tablet 3  . atorvastatin (LIPITOR) 40 MG tablet Take 1 tablet (40 mg total) by mouth daily. 90 tablet 3  . Blood Glucose Monitoring Suppl (ONE TOUCH ULTRA 2) w/Device KIT Check blood sugar as needed for symptoms of hypoglycemia. 1 each 0  . Cholecalciferol 50000 units TABS Take 1 tablet by mouth once a week. For 8 weeks 8 tablet 0  . fexofenadine (ALLEGRA) 180 MG tablet Take 180 mg by mouth daily.    Marland Kitchen glipiZIDE (GLUCOTROL) 5  MG tablet TAKE 1 TABLET (5 MG TOTAL) BY MOUTH DAILY BEFORE BREAKFAST. 90 tablet 3  . glucose blood (ONE TOUCH ULTRA TEST) test strip Check blood sugar as needed for symptoms of hypoglycemia 100 each 11  . hydrochlorothiazide (HYDRODIURIL) 25 MG tablet Take 1 tablet (25 mg total) by mouth daily. 90 tablet 3  . KLOR-CON M20 20 MEQ tablet TAKE 2 TABLETS BY MOUTH EVERY DAY 180 tablet 2  . Lancets (ONETOUCH ULTRASOFT) lancets Check blood sugar as needed for symptoms of hypoglycemia 100 each 11  . lisinopril (PRINIVIL,ZESTRIL) 20 MG tablet TAKE 1 TABLET EVERY DAY 90 tablet 2  . mometasone (NASONEX) 50 MCG/ACT nasal spray Place 2 sprays into the nose daily. 17 g 12   No current facility-administered medications for this visit.      Objective:   Physical Exam BP 126/72   Pulse 98   Temp 98.3 F (36.8 C) (Oral)   Ht _0  (1.6 m)   Wt 269 lb 12.8 oz (122.4 kg)   LMP 09/06/2011 Comment: 5 years ago   SpO2 98%   BMI 47.79 kg/m  Gen:  Patient sitting on exam table, appears stated age in no acute distress Head: Normocephalic atraumatic Eyes: EOMI, PERRL, sclera and conjunctiva non-erythematous Ears:  Canals clear bilaterally.  TMs pearly  gray bilaterally without erythema or bulging.   Nose:  Nasal turbinates with some clear exudates bilaterally Mouth: Mucosa membranes moist. Tonsils +2, nonenlarged, non-erythematous. Neck: No cervical lymphadenopathy noted Heart:  RRR, no murmurs auscultated. Pulm: Diffuse wheezing in all lung fields with decreased breath sounds bilateral bases.  Imp/Plan: 1.  Asthma exacerbation:   - triggered by recent URI.  Possibly becoming bronchitis due to length of symptoms.  - Duoneb treatment here in clinic.  Lung exam much improved s/p tx -- though still with some wheezing at bases. - Prednisone taper and doxy as per AVS.  Also see AVS for scheduled albuterol through weekend - FU if no improvement in 1 week.  Sooner if worsening.

## 2017-07-21 NOTE — Patient Instructions (Signed)
It was good to see you today.  Usual albuterol inhaler every 6 hours scheduled through the weekend.  If you need it you can actually use it every 4.  Also use it 2 puffs before you go to bed at night.  Take the prednisone taper as follows:  Take 6 pills today, 5 pills tomorrow, 4 pills today after, 3 pills after, 2 pills after, 1 pill last day  Take the doxycycline once in the morning and once the evening for the next 7 days.  If you are still having trouble in a week come back and see [place].  If you're feeling worse before that do not wait and come back sooner.

## 2017-08-08 ENCOUNTER — Telehealth: Payer: Self-pay

## 2017-08-08 NOTE — Telephone Encounter (Signed)
Patient left message on nurse line asking about FMLA forms that she states she faxed 2-3 weeks ago to PCP. They have not been received back and her case was closed on 2/14 because of this. I checked PCP box and the forms are there to be completed. Danley Danker, RN [facility] RN)

## 2017-08-10 NOTE — Telephone Encounter (Signed)
Called patient to clarify what she needs FMLA forms to say. They are just renewal of her prior forms. Completed, will scan copy in and place in fax pile. Leeanne Rio, MD

## 2017-08-31 ENCOUNTER — Telehealth: Payer: Self-pay

## 2017-08-31 NOTE — Telephone Encounter (Signed)
Patient left message asking for PCP to call her regarding her FMLA forms. Danley Danker, RN [facility] RN)

## 2017-08-31 NOTE — Telephone Encounter (Signed)
Returned call to patient. Having pain in back whenever she has to urinate. Lasts for a bit after she urinates. Has happened for the last 2 days. No dysuria. No fevers. Advised she should be seen. I have scheduled her to see me at 9:50am tomorrow.  Also evidently I left one of the questions blank on her FMLA papers. I will print them out, make the edit, and refax them, per her request.  Patient appreciative.  Leeanne Rio, MD

## 2017-09-01 ENCOUNTER — Other Ambulatory Visit: Payer: Self-pay

## 2017-09-01 ENCOUNTER — Encounter: Payer: Self-pay | Admitting: Family Medicine

## 2017-09-01 ENCOUNTER — Ambulatory Visit (INDEPENDENT_AMBULATORY_CARE_PROVIDER_SITE_OTHER): Payer: 59 | Admitting: Family Medicine

## 2017-09-01 VITALS — BP 114/82 | HR 94 | Temp 98.3°F | Ht 63.0 in | Wt 265.8 lb

## 2017-09-01 DIAGNOSIS — R3989 Other symptoms and signs involving the genitourinary system: Secondary | ICD-10-CM

## 2017-09-01 LAB — POCT URINALYSIS DIP (MANUAL ENTRY)
BILIRUBIN UA: NEGATIVE mg/dL
Bilirubin, UA: NEGATIVE
Blood, UA: NEGATIVE
GLUCOSE UA: NEGATIVE mg/dL
Leukocytes, UA: NEGATIVE
Nitrite, UA: NEGATIVE
Protein Ur, POC: NEGATIVE mg/dL
SPEC GRAV UA: 1.025 (ref 1.010–1.025)
Urobilinogen, UA: 0.2 E.U./dL
pH, UA: 5.5 (ref 5.0–8.0)

## 2017-09-01 MED ORDER — BACLOFEN 10 MG PO TABS: 5.0000 mg | ORAL_TABLET | Freq: Two times a day (BID) | ORAL | 0 refills | Status: DC | PRN

## 2017-09-01 NOTE — Patient Instructions (Signed)
Urine is fine on testing today. Sent in muscle relaxer for you to try - use caution as it may make you sleepy. Call if not getting better in a week or so.  Be well, Dr. Ardelia Mems

## 2017-09-01 NOTE — Progress Notes (Signed)
Date of Visit: 09/01/2017   HPI:  Patient presents for a same day appointment to discuss urinary issues & back pain.   This began a couple of days ago. Especially when sleeping, back starts hurting in her low back, only improved after she gets up and urinates. Has a little bit of pain in vulvar area before and after urinating. No vaginal discharge. Has not been sexually active in over 10 years. No fevers. No blood in pee that she's seen. Has history of similar episode that spontaneously resolved. No bowel incontinence. No new bladder incontinence.  ROS: See HPI  [place]: history of hypertension, diabetes, hyperlipidemia, menieres disease, vit D deficiency, OSA  PHYSICAL EXAM: BP 114/82   Pulse 94   Temp 98.3 F (36.8 C) (Oral)   Ht 5\' 3"  (1.6 m)   Wt 265 lb 12.8 oz (120.6 kg)   LMP 09/06/2011 Comment: 5 years ago   SpO2 98%   BMI 47.08 kg/m  Gen: no acute distress, pleasant, cooperative, well appearing HEENT: normocephalic, atraumatic  Heart: regular rate and rhythm  Lungs: clear to auscultation bilaterally  Neuro: grossly nonfocal, speech normal Back: mild tenderness of paraspinal musculature in back area. No cva tenderness bilaterally. Full strength & sensation bilateral lower extremities.   ASSESSMENT/PLAN:  1. Back pain/urinary symptoms: urinalysis today completely normal, which is reassuring against UTI or kidney stone. Offered pelvic exam to fully evaluate pelvic organs, in case this is somehow related to GYN organs, but patient wanted to defer that for now. At this time I favor musculoskeletal nonspecific low back pain. Will do trial of baclofen. Patient encouraged to call if not improving in a week or so.   FOLLOW UP: Follow up as needed if symptoms worsen or fail to improve.    [place]. Ardelia Mems, [place]

## 2017-09-23 ENCOUNTER — Other Ambulatory Visit: Payer: Self-pay | Admitting: Family Medicine

## 2017-10-02 ENCOUNTER — Other Ambulatory Visit: Payer: Self-pay

## 2017-10-09 ENCOUNTER — Other Ambulatory Visit: Payer: Self-pay

## 2017-10-11 NOTE — Telephone Encounter (Signed)
Called patient to inform her that her request for vitamin D3 was refused.  She informed me that she had only picked up one and originally she had an RX for three. Patient states that if she no longer needs it she is ok with that decision. Annette Cabrera, [place]

## 2017-11-05 ENCOUNTER — Other Ambulatory Visit: Payer: Self-pay | Admitting: Family Medicine

## 2017-11-06 NOTE — Telephone Encounter (Signed)
Please ask patient to schedule a follow up visit with me, thanks! Daina Cara J Dameian Crisman, MD  

## 2017-11-06 NOTE — Telephone Encounter (Signed)
Pt scheduled. Annette Cabrera, CMA  

## 2017-11-12 LAB — HM DIABETES EYE EXAM

## 2017-11-23 ENCOUNTER — Encounter: Payer: Self-pay | Admitting: Family Medicine

## 2017-11-28 ENCOUNTER — Ambulatory Visit: Payer: 59 | Admitting: Family Medicine

## 2017-11-30 ENCOUNTER — Other Ambulatory Visit: Payer: Self-pay | Admitting: *Deleted

## 2017-11-30 MED ORDER — GLIPIZIDE 5 MG PO TABS: 5.0000 mg | ORAL_TABLET | Freq: Every day | ORAL | 3 refills | Status: DC

## 2017-12-01 ENCOUNTER — Ambulatory Visit (INDEPENDENT_AMBULATORY_CARE_PROVIDER_SITE_OTHER): Payer: 59 | Admitting: Family Medicine

## 2017-12-01 ENCOUNTER — Encounter: Payer: Self-pay | Admitting: Family Medicine

## 2017-12-01 VITALS — BP 116/80 | HR 72 | Temp 98.2°F | Ht 63.0 in | Wt 270.6 lb

## 2017-12-01 DIAGNOSIS — E119 Type 2 diabetes mellitus without complications: Secondary | ICD-10-CM

## 2017-12-01 DIAGNOSIS — I10 Essential (primary) hypertension: Secondary | ICD-10-CM | POA: Diagnosis not present

## 2017-12-01 DIAGNOSIS — E559 Vitamin D deficiency, unspecified: Secondary | ICD-10-CM

## 2017-12-01 DIAGNOSIS — Z1211 Encounter for screening for malignant neoplasm of colon: Secondary | ICD-10-CM | POA: Diagnosis not present

## 2017-12-01 LAB — POCT GLYCOSYLATED HEMOGLOBIN (HGB A1C): HbA1c, POC (controlled diabetic range): 7.7 % — AB (ref 0.0–7.0)

## 2017-12-01 MED ORDER — EMPAGLIFLOZIN 10 MG PO TABS: 10.0000 mg | ORAL_TABLET | Freq: Every day | ORAL | 1 refills | Status: DC

## 2017-12-01 NOTE — Patient Instructions (Signed)
It was great to see you again today!  Sending in new medication for your diabetes Follow up in 3 months  See nutritionist at work  Referring to GI for colonoscopy  Be well, Dr. Ardelia Mems

## 2017-12-01 NOTE — Progress Notes (Signed)
Date of Visit: 12/01/2017   HPI:  Patient presents for routine follow up.  Hypertension - currently taking amlodipine 10mg  daily, lisinopril 20mg  daily, and HCTZ 25mg  daily. Tolerating these well.  Diabetes - taking glipizide 5mg  daily. Has had some dietary indiscretions lately and A1c is up to 7.7 from 7.1. She is motivated to make changes but also is agreeable to seeing a nutritionist and starting new medication. Does not check sugars but has meter, no symptoms of hyopglycemia. Does not exercise.  Vit D deficiency - only took 50,000 unit pill once. Has others left at home.   ROS: See HPI.  [place]: history of hypertension, type 2 diabetes, hyperlipidemia, menieres, OSA, vit D deficiency  PHYSICAL EXAM: BP 116/80 (BP Location: Right Arm, Patient Position: Sitting, Cuff Size: Large)   Pulse 72   Temp 98.2 F (36.8 C) (Oral)   Ht 5\' 3"  (1.6 m)   Wt 270 lb 9.6 oz (122.7 kg)   LMP 09/06/2011 Comment: 5 years ago   SpO2 98%   BMI 47.93 kg/m  Gen: no acute distress, pleasant, cooperative HEENT: normocephalic, atraumatic  Heart: regular rate and rhythm, no murmur Lungs: clear to auscultation bilaterally, normal work of breathing  Neuro: alert, grossly nonfocal Ext: No appreciable lower extremity edema bilaterally   ASSESSMENT/PLAN:  Health maintenance:  -refer to GI for colonoscopy  Diabetes type 2, controlled ([place]) Worsened control since last check in January, A1c 7.7. Add jardiance 10mg  daily. Patient to see nutritionist at work. Exercise encouraged. Follow up in 3 months for next A1c.  Hypertension Well controlled. Continue current regimen.   Vitamin D deficiency Has not consistently taken 50k unit pills (only took once). She will finish these and schedule lab visit when she is done to recheck level.  FOLLOW UP: Follow up in 3 months for diabetes Lab visit in ~1 month Referring to GI for colonoscopy  [place] J. Ardelia Mems, [place]

## 2017-12-01 NOTE — Assessment & Plan Note (Signed)
Has not consistently taken 50k unit pills (only took once). She will finish these and schedule lab visit when she is done to recheck level.

## 2017-12-01 NOTE — Assessment & Plan Note (Signed)
Well-controlled.  Continue current regimen. 

## 2017-12-01 NOTE — Assessment & Plan Note (Signed)
Worsened control since last check in January, A1c 7.7. Add jardiance 10mg  daily. Patient to see nutritionist at work. Exercise encouraged. Follow up in 3 months for next A1c.

## 2017-12-05 ENCOUNTER — Telehealth: Payer: Self-pay | Admitting: *Deleted

## 2017-12-05 NOTE — Telephone Encounter (Signed)
Pt wants to verify that she is supposed to be on Jardiance and glipizide.    Upon reviewing notes and med list, she is supposed to be on both.  Pt informed. Maeby Vankleeck, Salome Spotted, CMA

## 2017-12-06 ENCOUNTER — Encounter: Payer: Self-pay | Admitting: Gastroenterology

## 2017-12-13 ENCOUNTER — Ambulatory Visit (AMBULATORY_SURGERY_CENTER): Payer: Self-pay

## 2017-12-13 VITALS — Ht 62.0 in | Wt 268.8 lb

## 2017-12-13 DIAGNOSIS — Z1211 Encounter for screening for malignant neoplasm of colon: Secondary | ICD-10-CM

## 2017-12-13 MED ORDER — NA SULFATE-K SULFATE-MG SULF 17.5-3.13-1.6 GM/177ML PO SOLN: 1.0000 | Freq: Once | ORAL | 0 refills | Status: AC

## 2017-12-13 NOTE — Progress Notes (Signed)
Per pt, no allergies to soy or egg products.Pt not taking any weight loss meds or using  O2 at home.  Pt refused emmi video. 

## 2017-12-14 ENCOUNTER — Encounter: Payer: Self-pay | Admitting: Gastroenterology

## 2017-12-26 ENCOUNTER — Other Ambulatory Visit: Payer: Self-pay

## 2017-12-28 ENCOUNTER — Ambulatory Visit (AMBULATORY_SURGERY_CENTER): Payer: 59 | Admitting: Gastroenterology

## 2017-12-28 ENCOUNTER — Encounter: Payer: Self-pay | Admitting: Gastroenterology

## 2017-12-28 VITALS — BP 117/76 | HR 92 | Temp 99.3°F | Resp 19 | Ht 62.0 in | Wt 268.0 lb

## 2017-12-28 DIAGNOSIS — Z1211 Encounter for screening for malignant neoplasm of colon: Secondary | ICD-10-CM | POA: Diagnosis present

## 2017-12-28 DIAGNOSIS — D122 Benign neoplasm of ascending colon: Secondary | ICD-10-CM

## 2017-12-28 MED ORDER — ATORVASTATIN CALCIUM 40 MG PO TABS: 40.0000 mg | ORAL_TABLET | Freq: Every day | ORAL | 3 refills | Status: DC

## 2017-12-28 MED ORDER — SODIUM CHLORIDE 0.9 % IV SOLN: 500.0000 mL | Freq: Once | INTRAVENOUS | Status: DC

## 2017-12-28 NOTE — Progress Notes (Signed)
Called to room to assist during endoscopic procedure.  Patient ID and intended procedure confirmed with present staff. Received instructions for my participation in the procedure from the performing physician.  

## 2017-12-28 NOTE — Progress Notes (Signed)
Pt's states no medical or surgical changes since previsit or office visit. 

## 2017-12-28 NOTE — Op Note (Signed)
[facility] Patient Name: Annette Cabrera Procedure Date: 12/28/2017 8:52 AM MRN: [id number] Endoscopist: Jackquline Denmark , MD Age: 57 Referring MD:  Date of Birth: 08-26-1960 Gender: Female Account #: [id number] Procedure:                Colonoscopy Indications:              Screening for colorectal malignant neoplasm Medicines:                Monitored Anesthesia Care Procedure:                Pre-Anesthesia Assessment:                           - Prior to the procedure, a History and Physical                            was performed, and patient medications and                            allergies were reviewed. The patient is competent.                            The risks and benefits of the procedure and the                            sedation options and risks were discussed with the                            patient. All questions were answered and informed                            consent was obtained. Patient identification and                            proposed procedure were verified in the procedure                            room. Mental Status Examination: alert and                            oriented. Prophylactic Antibiotics: The patient                            does not require prophylactic antibiotics. Prior                            Anticoagulants: The patient has taken no previous                            anticoagulant or antiplatelet agents. ASA Grade                            Assessment: II - A patient with mild systemic  disease. After reviewing the risks and benefits,                            the patient was deemed in satisfactory condition to                            undergo the procedure. The anesthesia plan was to                            use monitored anesthesia care (MAC). Immediately                            prior to administration of medications, the patient                            was re-assessed  for adequacy to receive sedatives.                            The heart rate, respiratory rate, oxygen                            saturations, blood pressure, adequacy of pulmonary                            ventilation, and response to care were monitored                            throughout the procedure. The physical status of                            the patient was re-assessed after the procedure.                           After obtaining informed consent, the colonoscope                            was passed under direct vision. Throughout the                            procedure, the patient's blood pressure, pulse, and                            oxygen saturations were monitored continuously. The                            Colonoscope was introduced through the anus and                            advanced to the the cecum, identified by                            appendiceal orifice and ileocecal valve. The  colonoscopy was performed without difficulty. The                            patient tolerated the procedure well. The quality                            of the bowel preparation was excellent. Scope In: 8:54:37 AM Scope Out: 9:10:29 AM Scope Withdrawal Time: 0 hours 7 minutes 45 seconds  Total Procedure Duration: 0 hours 15 minutes 52 seconds  Findings:                 A 10 mm polyp was found in the proximal ascending                            colon. The polyp was semi-pedunculated. The polyp                            was removed with a hot snare. Resection and                            retrieval were complete.                           A few rare small-mouthed diverticula were found in                            the sigmoid colon.                           Non-bleeding internal hemorrhoids were found during                            retroflexion. The hemorrhoids were small.                           The exam was otherwise without abnormality  on                            direct and retroflexion views. Complications:            No immediate complications. Estimated Blood Loss:     Estimated blood loss: none. Impression:               - Colonic polyps status post polypectomy.                           - Minimal sigmoid diverticulosis.                           - Otherwise normal colonoscopy. Recommendation:           - Patient has a contact number available for                            emergencies. The signs and symptoms of potential  delayed complications were discussed with the                            patient. Return to normal activities tomorrow.                            Written discharge instructions were provided to the                            patient.                           - Resume previous diet.                           - Continue present medications.                           - Await pathology results.                           - No aspirin, ibuprofen, naproxen, or other                            non-steroidal anti-inflammatory drugs for 5 days                            after polyp removal.                           - Repeat colonoscopy for surveillance based on                            pathology results.                           - Return to GI clinic PRN. Jackquline Denmark, MD 12/28/2017 9:16:19 AM This report has been signed electronically.

## 2017-12-28 NOTE — Progress Notes (Signed)
Report to PACU, RN, vss, BBS= Clear.  

## 2017-12-28 NOTE — Patient Instructions (Signed)
YOU HAD AN ENDOSCOPIC PROCEDURE TODAY AT THE [facility]:   Refer to the procedure report that was given to you for any specific questions about what was found during the examination.  If the procedure report does not answer your questions, please call your gastroenterologist to clarify.  If you requested that your care partner not be given the details of your procedure findings, then the procedure report has been included in a sealed envelope for you to review at your convenience later.  YOU SHOULD EXPECT: Some feelings of bloating in the abdomen. Passage of more gas than usual.  Walking can help get rid of the air that was put into your GI tract during the procedure and reduce the bloating. If you had a lower endoscopy (such as a colonoscopy or flexible sigmoidoscopy) you may notice spotting of blood in your stool or on the toilet paper. If you underwent a bowel prep for your procedure, you may not have a normal bowel movement for a few days.  Please Note:  You might notice some irritation and congestion in your nose or some drainage.  This is from the oxygen used during your procedure.  There is no need for concern and it should clear up in a day or so.  SYMPTOMS TO REPORT IMMEDIATELY:   Following lower endoscopy (colonoscopy or flexible sigmoidoscopy):  Excessive amounts of blood in the stool  Significant tenderness or worsening of abdominal pains  Swelling of the abdomen that is new, acute  Fever of 100F or higher  For urgent or emergent issues, a gastroenterologist can be reached at any hour by calling [phone].   DIET:  We do recommend a small meal at first, but then you may proceed to your regular diet.  Drink plenty of fluids but you should avoid alcoholic beverages for 24 hours.  MEDICATIONS: Continue present medications.  Please see handouts given to you by your recovery nurse.  ACTIVITY:  You should plan to take it easy for the rest of today and you should NOT  DRIVE or use heavy machinery until tomorrow (because of the sedation medicines used during the test).    FOLLOW UP: Our staff will call the number listed on your records the next business day following your procedure to check on you and address any questions or concerns that you may have regarding the information given to you following your procedure. If we do not reach you, we will leave a message.  However, if you are feeling well and you are not experiencing any problems, there is no need to return our call.  We will assume that you have returned to your regular daily activities without incident.  If any biopsies were taken you will be contacted by phone or by letter within the next 1-3 weeks.  Please call us at [phone] if you have not heard about the biopsies in 3 weeks.   Thank you for allowing us to provide for your healthcare needs today.   SIGNATURES/CONFIDENTIALITY: You and/or your care partner have signed paperwork which will be entered into your electronic medical record.  These signatures attest to the fact that that the information above on your After Visit Summary has been reviewed and is understood.  Full responsibility of the confidentiality of this discharge information lies with you and/or your care-partner. 

## 2017-12-29 ENCOUNTER — Telehealth: Payer: Self-pay

## 2017-12-29 NOTE — Telephone Encounter (Signed)
  Follow up Call-  Call back number 12/28/2017  Post procedure Call Back phone  # [phone]  Permission to leave phone message No  Some recent data might be hidden     Patient questions:  Do you have a fever, pain , or abdominal swelling? No. Pain Score  0 *  Have you tolerated food without any problems? Yes.  Have you been able to return to your normal activities? Yes.    Do you have any questions about your discharge instructions: Diet   No. Medications  No. Follow up visit  No.  Do you have questions or concerns about your Care? No.  Actions: * If pain score is 4 or above: No action needed, pain <4.

## 2017-12-29 NOTE — Telephone Encounter (Signed)
  Follow up Call-  Call back number 12/28/2017  Post procedure Call Back phone  # [phone]  Permission to leave phone message No  Some recent data might be hidden     Voicemail stated "this person is not accepting calls right now" Necola Bluestein/Call-back [facility] 1st attempt

## 2018-01-02 ENCOUNTER — Encounter: Payer: Self-pay | Admitting: Gastroenterology

## 2018-01-26 ENCOUNTER — Telehealth: Payer: Self-pay | Admitting: *Deleted

## 2018-01-26 NOTE — Telephone Encounter (Signed)
Pt returned call.  She has chosen to make appt Wednesday with Dr. Erin Hearing, so that she does not have to miss a day of work.  Upon further discussion the itching seems to be more in the groin area and she is using monistat.  She still declines appt today.    She still chooses to stop jardiance as she feels this is contributing. [place], Salome Spotted, CMA

## 2018-01-26 NOTE — Telephone Encounter (Signed)
Pt lm on nurse line, stating that she thinks she is allergic to [facility] because she has been itching since starting it.   Spoke with Dr. Ardelia Mems, she can be seen today OR she can stop the Jardiance and be seen next week for her diabetes bu another provider.   Attempted to call pt back, but no answer. LM for her to return call. Kristof Nadeem, Salome Spotted, CMA

## 2018-01-31 ENCOUNTER — Ambulatory Visit (INDEPENDENT_AMBULATORY_CARE_PROVIDER_SITE_OTHER): Payer: 59 | Admitting: Family Medicine

## 2018-01-31 ENCOUNTER — Encounter: Payer: Self-pay | Admitting: Family Medicine

## 2018-01-31 VITALS — BP 130/80 | HR 112 | Temp 98.8°F | Wt 272.0 lb

## 2018-01-31 DIAGNOSIS — L293 Anogenital pruritus, unspecified: Secondary | ICD-10-CM | POA: Insufficient documentation

## 2018-01-31 DIAGNOSIS — R300 Dysuria: Secondary | ICD-10-CM | POA: Diagnosis not present

## 2018-01-31 DIAGNOSIS — E119 Type 2 diabetes mellitus without complications: Secondary | ICD-10-CM | POA: Diagnosis not present

## 2018-01-31 DIAGNOSIS — L299 Pruritus, unspecified: Secondary | ICD-10-CM | POA: Diagnosis not present

## 2018-01-31 LAB — POCT UA - MICROSCOPIC ONLY

## 2018-01-31 LAB — POCT URINALYSIS DIP (MANUAL ENTRY)
BILIRUBIN UA: NEGATIVE
BILIRUBIN UA: NEGATIVE mg/dL
Glucose, UA: 500 mg/dL — AB
Leukocytes, UA: NEGATIVE
Nitrite, UA: NEGATIVE
PROTEIN UA: NEGATIVE mg/dL
SPEC GRAV UA: 1.015 (ref 1.010–1.025)
Urobilinogen, UA: 0.2 E.U./dL
pH, UA: 5 (ref 5.0–8.0)

## 2018-01-31 LAB — POCT GLYCOSYLATED HEMOGLOBIN (HGB A1C): HBA1C, POC (CONTROLLED DIABETIC RANGE): 7 % (ref 0.0–7.0)

## 2018-01-31 LAB — POCT WET PREP (WET MOUNT)
Clue Cells Wet Prep Whiff POC: NEGATIVE
Trichomonas Wet Prep HPF POC: ABSENT

## 2018-01-31 NOTE — Assessment & Plan Note (Signed)
Will retry Jardiance once symptoms resolve.  If return will stop for good.  Had back pain with metformin?  Might want to retry extended release at low dose.  Weight is worsening suggested Integrative care for beh change - she will consider

## 2018-01-31 NOTE — Patient Instructions (Addendum)
Good to see you today!  Thanks for coming in.  Stop the Jardiance for at least two week after the symptoms resolve.  If the symptoms come back let me know We might consider Metformin again   Call about seeing our [place] counselor about behavior change.    If the irritation does not continue to improve then call us

## 2018-01-31 NOTE — Assessment & Plan Note (Signed)
Likely due to Jardiance since is improving when stopped.  See after visit summary for plan

## 2018-01-31 NOTE — Progress Notes (Signed)
Subjective  Annette Cabrera is a 57 y.o. female is presenting with the following  PERINEUM ITCHING IRRITATION  Started about the time of her colonscopy and starting Jardiance.  No vaginal discharge or definite increased frequency no fever or back pain, no rectal bleeding and bowel movement are normal.   DIABETES Disease Monitoring: Blood Sugar ranges(Severity) -not checking  Associated Symptoms- Polyuria/phagia/dipsia- no      Visual problems- no Medications: Compliance(Modifying factor) - stopped Jardiance about a week ago.  Eating worse due to two teenagers living with her now Hypoglycemic symptoms- no Timing - continuous  Monitoring Labs and Parameters Last A1C:  Lab Results  Component Value Date   HGBA1C 7.0 01/31/2018   Last Lipid:     Component Value Date/Time   CHOL 130 03/24/2017 1118   HDL 62 03/24/2017 1118   Last Bmet  Potassium  Date Value Ref Range Status  08/09/2016 3.9 3.5 - 5.3 mmol/L Final   Sodium  Date Value Ref Range Status  08/09/2016 140 135 - 146 mmol/L Final   Creat  Date Value Ref Range Status  08/09/2016 0.66 0.50 - 1.05 mg/dL Final    Comment:      For patients > or = 56 years of age: The upper reference limit for Creatinine is approximately 13% higher for people identified as African-American.              Chief Complaint noted Review of Symptoms - see HPI PMH - Smoking status noted.    Objective Vital Signs reviewed BP 130/80   Pulse (!) 112   Temp 98.8 F (37.1 C)   Wt 272 lb (123.4 kg)   SpO2 95%   BMI 49.75 kg/m  External genitalia - mild diffuse erythema without lesions or excoriations.  No vaginal discharge or mucosal/cervix lesions   Assessments/Plans  See after visit summary for details of patient instuctions  Diabetes type 2, controlled ([place]) Will retry Jardiance once symptoms resolve.  If return will stop for good.  Had back pain with metformin?  Might want to retry extended release at low dose.  Weight is  worsening suggested Integrative care for beh change - she will consider   Perineal irritation Likely due to Jardiance since is improving when stopped.  See after visit summary for plan

## 2018-02-26 ENCOUNTER — Telehealth: Payer: Self-pay

## 2018-02-26 NOTE — Telephone Encounter (Signed)
Pt states she started taking Jardiance on 02/20/18, and "irritation is back". Feels like she may need to stop it, and is willing to try metformin. States Dr. Erin Hearing will know what this is about. Call back [phone] [facility] [place]  Wallace Cullens, RN

## 2018-02-27 ENCOUNTER — Telehealth: Payer: Self-pay | Admitting: Family Medicine

## 2018-02-27 NOTE — Telephone Encounter (Signed)
FMLA form dropped off for work at front desk for completion.  Verified that patient section of form has been completed.  Last DOS/WCC with PCP was 01/31/2018.  Placed form in team folder to be completed by clinical staff.  Eldred Manges [place]

## 2018-02-27 NOTE — Telephone Encounter (Signed)
Pt calling again requesting a phone call from Dr. Erin Hearing. Please advise

## 2018-02-27 NOTE — Telephone Encounter (Signed)
Clinical info completed on FMLA form.  Place form in [place]' box for completion in Dr. Ardelia Mems abscence.  Annette Cabrera, Salome Spotted, CMA

## 2018-02-28 ENCOUNTER — Other Ambulatory Visit: Payer: Self-pay | Admitting: Family Medicine

## 2018-02-28 DIAGNOSIS — E119 Type 2 diabetes mellitus without complications: Secondary | ICD-10-CM

## 2018-02-28 MED ORDER — METFORMIN HCL ER 500 MG PO TB24: 500.0000 mg | ORAL_TABLET | Freq: Every day | ORAL | 2 refills | Status: DC

## 2018-02-28 NOTE — Telephone Encounter (Signed)
Having symptoms with Jardiance.  Will stop and try metformin XR  She will let us know if has troubles taking this

## 2018-03-01 NOTE — Telephone Encounter (Signed)
Form completed and faxed as requested.   Made copy for batch scanning, and placed original up front for pick up.  Attempted to call pt.  No answer and no machine. [place], Salome Spotted, CMA

## 2018-03-01 NOTE — Telephone Encounter (Signed)
Pt called nurse line, aware forms are ready for pick up Wallace Cullens, RN

## 2018-03-15 ENCOUNTER — Telehealth: Payer: Self-pay

## 2018-03-15 NOTE — Telephone Encounter (Signed)
Patient left message. She was recently started back on Metformin. She is unsure whether she is supposed to still take the Glipizide.   She has only been taking Glipizide due to concerns with taking both.  Call back is [phone]  Danley Danker, RN [facility] RN)

## 2018-03-15 NOTE — Telephone Encounter (Signed)
Patient aware. Alisa Brake, RN ([facility] RN)  

## 2018-03-15 NOTE — Telephone Encounter (Signed)
Attempted to call pt and no answer/voicemail set up. Please inform her of message below if she calls back. Stanislaw Acton Kennon Holter, CMA

## 2018-03-15 NOTE — Telephone Encounter (Signed)
Please call her  She should take metformin once a day along with her glipizide.  If she is tolerating that ok she should take metformin twice a day and stop the glipizide  Come in November for a diabetes check  Thanks  Truman Hayward

## 2018-04-09 ENCOUNTER — Other Ambulatory Visit: Payer: Self-pay | Admitting: Family Medicine

## 2018-05-29 ENCOUNTER — Telehealth: Payer: Self-pay | Admitting: Family Medicine

## 2018-05-29 ENCOUNTER — Other Ambulatory Visit: Payer: Self-pay | Admitting: Family Medicine

## 2018-05-29 NOTE — Telephone Encounter (Signed)
Placed FMLA forms in PCP's box for completion.  Annette Cabrera, [place]

## 2018-05-29 NOTE — Telephone Encounter (Signed)
Patient dropped off FMLA forms to be completed. Requesting forms to be faxed to fax number on forms once completed.  Forms placed in red folder

## 2018-06-08 NOTE — Telephone Encounter (Signed)
Forms completed, will return to [facility] RN team Please scan copy into chart Leeanne Rio, MD

## 2018-06-11 NOTE — Telephone Encounter (Signed)
Forms faxed and copy has been made for batch scanning.

## 2018-07-10 ENCOUNTER — Ambulatory Visit (INDEPENDENT_AMBULATORY_CARE_PROVIDER_SITE_OTHER): Payer: 59 | Admitting: Family Medicine

## 2018-07-10 VITALS — BP 122/80 | HR 103 | Temp 98.5°F | Wt 267.6 lb

## 2018-07-10 DIAGNOSIS — E119 Type 2 diabetes mellitus without complications: Secondary | ICD-10-CM

## 2018-07-10 DIAGNOSIS — E559 Vitamin D deficiency, unspecified: Secondary | ICD-10-CM

## 2018-07-10 DIAGNOSIS — R350 Frequency of micturition: Secondary | ICD-10-CM | POA: Diagnosis not present

## 2018-07-10 LAB — POCT URINALYSIS DIP (MANUAL ENTRY)
Bilirubin, UA: NEGATIVE
Blood, UA: NEGATIVE
Glucose, UA: 100 mg/dL — AB
Ketones, POC UA: NEGATIVE mg/dL
Leukocytes, UA: NEGATIVE
Nitrite, UA: NEGATIVE
Protein Ur, POC: NEGATIVE mg/dL
Spec Grav, UA: 1.02 (ref 1.010–1.025)
Urobilinogen, UA: 0.2 E.U./dL
pH, UA: 5.5 (ref 5.0–8.0)

## 2018-07-10 LAB — POCT GLYCOSYLATED HEMOGLOBIN (HGB A1C): HbA1c, POC (controlled diabetic range): 8.3 % — AB (ref 0.0–7.0)

## 2018-07-10 MED ORDER — GLUCOSE BLOOD VI STRP: ORAL_STRIP | 11 refills | Status: DC

## 2018-07-10 NOTE — Progress Notes (Signed)
error 

## 2018-07-10 NOTE — Progress Notes (Addendum)
   Subjective:    HPI:  Annette Cabrera is a 58yo F who presents for diabetes check.  Dysuria - She reports a recent episode of dysuria 2 weeks prior, after which she has been drinking increased amounts of water and urinating more frequently (2-3x/hour). She feels this is a recent change caused by her increased water intake.  She reports no change in vision, no abdominal pain, nausea/vomiting. No fevers. Dysuria has since resolved.  Diabetes - She is prescribed Metformin 2x/day, but reports she is often forgetting to take her nighttime dose. She reports some loose stool with Metformin, but reports no diarrhea, vomiting. She is not currently measuring her blood glucose. She states she is having her kitchen renovated and is now eating more fast food in the Cabrera month. She does report motivation to get back to cooking more food. She is also more interested in exercise as her workplace recently opened a gym which she can access in the mornings. No chest pain or shortness of breath.  Vit D deficiency - not taking any vit D presently. Due for recheck.   Objective:     BP 122/80   Pulse (!) 103   Temp 98.5 F (36.9 C)   Wt 267 lb 9.6 oz (121.4 kg)   SpO2 99%   BMI 48.94 kg/m   Gen: NAD, pleasant, cooperative HEENT: NCAT Heart: RRR, no murmurs Lungs: CTAB, normal work of breathing Back: no CVA tenderness ABd: soft, nontender to palpation, no masses Neuro: grossly nonfocal, speech normal, follows commands, alert and oriented  Diabetic foot exam: 2+ DP pulses bilat, normal monofilament testing bilaterally. No lesions or significant calluses.     Assessment & Plan:   Health maintenance - normal foot exam today - otherwise UTD on HM items  Diabetes type 2, controlled ([facility]) -A1c increased from 7.0 (Aug 2019) to 8.3 -Normal Foot Exam today -Will begin taking Metformin 2x/day. Will utilize phone reminders to make sure she takes her evening dose. -Will begin improving diet by starting with 1  salad/week for lunch. Will decrease amount of take out food and start cooking more for herself. -Will begin morning routine of exercise at work gym in mornings prior to 9am -return for fasting labs to check cmet, lipids -follow up in 3 months   Vitamin D deficiency Check Vit D at future lab visit, advised to schedule  Dysuria Symptoms resolved, UA unremarkable. Continue to monitor.    Return in about 3 months (around 10/09/2018).  Annette Cabrera, Medical Student  Patient seen along with MS3 student Annette Cabrera. I personally evaluated this patient along with the student, and verified all aspects of the history, physical exam, and medical decision making as documented by the student. I agree with the student's documentation and have made all necessary edits.  Annette Netters, MD [place]

## 2018-07-10 NOTE — Patient Instructions (Signed)
It was great to see you again today!  Work on [employer], being active, taking metformin twice daily.  Schedule lab appointment (fasting) Follow up with me in 3 months, sooner if needed  Be well, Dr. Ardelia Mems

## 2018-07-13 ENCOUNTER — Other Ambulatory Visit: Payer: Self-pay | Admitting: Family Medicine

## 2018-07-13 NOTE — Assessment & Plan Note (Signed)
-  A1c increased from 7.0 (Aug 2019) to 8.3 -Normal Foot Exam today -Will begin taking Metformin 2x/day. Will utilize phone reminders to make sure she takes her evening dose. -Will begin improving diet by starting with 1 salad/week for lunch. Will decrease amount of take out food and start cooking more for herself. -Will begin morning routine of exercise at work gym in mornings prior to 9am -return for fasting labs to check cmet, lipids -follow up in 3 months

## 2018-07-13 NOTE — Assessment & Plan Note (Signed)
Check Vit D at future lab visit, advised to schedule

## 2018-10-22 ENCOUNTER — Ambulatory Visit (INDEPENDENT_AMBULATORY_CARE_PROVIDER_SITE_OTHER): Payer: 59 | Admitting: Family Medicine

## 2018-10-22 ENCOUNTER — Telehealth: Payer: 59 | Admitting: Family Medicine

## 2018-10-22 ENCOUNTER — Telehealth: Payer: Self-pay | Admitting: Family Medicine

## 2018-10-22 ENCOUNTER — Other Ambulatory Visit: Payer: Self-pay

## 2018-10-22 VITALS — BP 132/80 | HR 112

## 2018-10-22 DIAGNOSIS — E119 Type 2 diabetes mellitus without complications: Secondary | ICD-10-CM

## 2018-10-22 LAB — POCT GLYCOSYLATED HEMOGLOBIN (HGB A1C): HbA1c, POC (controlled diabetic range): 10.4 % — AB (ref 0.0–7.0)

## 2018-10-22 MED ORDER — METFORMIN HCL ER 500 MG PO TB24: 1000.0000 mg | ORAL_TABLET | Freq: Two times a day (BID) | ORAL | 0 refills | Status: DC

## 2018-10-22 MED ORDER — INSULIN DEGLUDEC 100 UNIT/ML ~~LOC~~ SOPN: 10.0000 [IU] | PEN_INJECTOR | Freq: Every day | SUBCUTANEOUS | 11 refills | Status: DC

## 2018-10-22 MED ORDER — INSULIN DEGLUDEC 100 UNIT/ML ~~LOC~~ SOPN: 10.0000 [IU] | PEN_INJECTOR | Freq: Every day | SUBCUTANEOUS | 0 refills | Status: DC

## 2018-10-22 NOTE — Telephone Encounter (Signed)
[place] patient to discuss upcoming visit for hyperglycemia.  The patient reports last week she noticed she was feeling dizzy, with some blurry vision and nausea and vomiting.  She took her blood sugar and it was above 400.  At that time she was consuming large volumes of white rice.  She reports that she has tried to alter her diet somewhat but has had persistent hyperglycemia in the mornings to 300s.  Her morning blood sugar this morning was 323.  Her a.m. glucose yesterday was 369.  She denies nausea or vomiting.  She still feels somewhat weak.  She denies any further blurry vision.  She endorses some polyuria and polydipsia.  Given the above symptoms and concern for need for labs as well as in detail discussion regarding starting insulin recommended evaluation in our clinic.  She lives in [facility] and is unable to make an appointment this morning.  I offered seeing her late this morning or this afternoon.  She opted for an afternoon appointment with Dr. Tammi Klippel. She will wear mask, no symptoms of SARS CoV2   Discussed with Dr. Tammi Klippel.  Recommend H0W, metabolic panel and consideration of starting insulin at 10 units in the morning.  First dose could be administered in clinic this afternoon with our pens available in clinic.

## 2018-10-22 NOTE — Progress Notes (Signed)
   Subjective:    Patient ID: Annette Cabrera, female    DOB: 1960/06/25, 58 y.o.   MRN: [id number]   CC: Diabetes  HPI: Diabetes Patient presenting today after having tele-health visit for elevated sugars.  Patient reports that fasting sugars at home have all been in the 300s for the past week.  Does not normally check her sugars but has been "feeling bad" all week so she started to check fasting sugars.  States that she has felt dizzy several days.  Has a stomachache.  Has been more sleepy and fatigued.  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  Has also been drinking lots more water.  Notes more stress.  Also tingling in her feet.  Denies any ulcers. Fasting checks: 316-368 this week Post prandial does not check  Compliance: good compliance to medications Diet: Poor, eats lots of rice.  Not many sweets. Eye exam: Up-to-date, 10/2017 Foot exam: Up-to-date, 06/2018 A1C: 10.4 (10/22/18), 8.3 on 07/10/18) Symptoms: no symptoms of hypoglycemia. Yes to symptoms of  polyuria, polydipsia. reports numbness in extremities, and no foot ulcers/trauma Meds: metformin 500 mg bid      Objective:  BP 132/80   Pulse (!) 112   SpO2 95%  Vitals and nursing note reviewed  General: well nourished, in no acute distress HEENT: normocephalic, PERRL, no scleral icterus or conjunctival pallor, no nasal discharge, moist mucous membranes, good dentition without erythema or discharge noted in posterior oropharynx Neck: supple, non-tender, without lymphadenopathy Cardiac: RRR, clear S1 and S2, no murmurs, rubs, or gallops Respiratory: clear to auscultation bilaterally, no increased work of breathing Abdomen: soft, nontender, nondistended, no masses or organomegaly. Bowel sounds present Extremities: no edema or cyanosis. Warm, well perfused.   Skin: warm and dry, no rashes noted Neuro: alert and oriented, no focal deficits   Assessment & Plan:     Diabetes type 2, controlled ([place]) Poorly controlled. A1C 10.4. Given elevated sugars despite metformin. Will plan to increase metformin to 1000mg  bid. Have also started tresiba 10U daily. Advised to increase by 1U for every fasting sugar >150. Discussed patient with Dr. Valentina Lucks as well who was able to provide teaching to patient. Discussed at length importance of healthy diet low in carbs. Patient agreeable to try and cut back on rice. Patient to follow up with Dr. Valentina Lucks in 1 week. Advised to follow up with PCP in 1 month. Will obtain BMP to ensure adequate kidney function.     Return in about 4 weeks (around 11/19/2018) for Diabetes f/u with PCP.   Caroline More, DO, PGY-2

## 2018-10-22 NOTE — Patient Instructions (Addendum)
  Diet Recommendations for Diabetes   Starchy (carb) foods: Bread, rice, pasta, potatoes, corn, cereal, grits, crackers, bagels, muffins, all baked goods.  (Fruits, milk, and yogurt also have carbohydrate, but most of these foods will not spike your blood sugar as the starchy foods will.)  A few fruits do cause high blood sugars; use small portions of bananas (limit to 1/2 at a time), grapes, watermelon, oranges, and most tropical fruits.    Protein foods: Meat, fish, poultry, eggs, dairy foods, and beans such as pinto and kidney beans (beans also provide carbohydrate).   1. Eat at least 3 meals and 1-2 snacks per day. Never go more than 4-5 hours while awake without eating. Eat breakfast within the first hour of getting up.   2. Limit starchy foods to TWO per meal and ONE per snack. ONE portion of a starchy  food is equal to the following:   - ONE slice of bread (or its equivalent, such as half of a hamburger bun).   - 1/2 cup of a "scoopable" starchy food such as potatoes or rice.   - 15 grams of carbohydrate as shown on food label.  3. Include at every meal: a protein food, a carb food, and vegetables and/or fruit.   - Obtain twice the volume of veg's as protein or carbohydrate foods for both lunch and dinner.   - Fresh or frozen veg's are best.   - Keep frozen veg's on hand for a quick vegetable serving.        For your diabetes I have started you on insulin today Tyler Aas). Please start with 10U every morning. For every day your fasting blood sugar is >150 take 1U more. For example. Take 10U today, tomorrow if your blood sugar is >150 --> take 11U tresiba. If your fasting blood sugar the day after that is still >150 --> take 12U tresiba. Continue this plan until your fasting blood sugar is 150.   You will be getting a phone call from Dr. Valentina Lucks and the pharmacy team this Friday for a telemedicine visit to discuss your sugars. Please follow up in 1 month with Dr. Ardelia Mems to discuss your  diabetes.   Please go to the emergency room if you have any shortness of breath or chest pain.   Dr. Tammi Klippel

## 2018-10-22 NOTE — Progress Notes (Signed)
See telephone note, triaged to PM appointment.   Dorris Singh, MD  Family Medicine Teaching Service

## 2018-10-22 NOTE — Assessment & Plan Note (Signed)
Poorly controlled. A1C 10.4. Given elevated sugars despite metformin. Will plan to increase metformin to 1000mg  bid. Have also started tresiba 10U daily. Advised to increase by 1U for every fasting sugar >150. Discussed patient with Dr. Valentina Lucks as well who was able to provide teaching to patient. Discussed at length importance of healthy diet low in carbs. Patient agreeable to try and cut back on rice. Patient to follow up with Dr. Valentina Lucks in 1 week. Advised to follow up with PCP in 1 month. Will obtain BMP to ensure adequate kidney function.

## 2018-10-22 NOTE — Progress Notes (Signed)
Asked by Dr. Tammi Klippel to educate patient on insulin injection.    Patient educated on purpose, proper use and potential adverse effects of hypoglycemia.  Following instruction patient verbalized understanding of treatment plan and demonstrated adequate technique.   Medication Samples have been provided to the patient.  Drug name: Tyler Aas       Strength: 100units/ml        Qty: 1  LOT: [id number]   Exp.Date: 12/18/2019  Dosing instructions: 10 units daily (first dose in office today) and increase by 1 unit if fasting CBG > 150 daily.    Janeann Forehand 3:12 PM 10/22/2018

## 2018-10-23 ENCOUNTER — Encounter: Payer: Self-pay | Admitting: Family Medicine

## 2018-10-23 LAB — BASIC METABOLIC PANEL
BUN/Creatinine Ratio: 21 (ref 9–23)
BUN: 16 mg/dL (ref 6–24)
CO2: 23 mmol/L (ref 20–29)
Calcium: 11.2 mg/dL — ABNORMAL HIGH (ref 8.7–10.2)
Chloride: 96 mmol/L (ref 96–106)
Creatinine, Ser: 0.77 mg/dL (ref 0.57–1.00)
GFR calc Af Amer: 100 mL/min/{1.73_m2} (ref >59)
GFR calc non Af Amer: 87 mL/min/{1.73_m2} (ref >59)
Glucose: 220 mg/dL — ABNORMAL HIGH (ref 65–99)
Potassium: 4 mmol/L (ref 3.5–5.2)
Sodium: 135 mmol/L (ref 134–144)

## 2018-11-01 ENCOUNTER — Telehealth: Payer: 59 | Admitting: Pharmacist

## 2018-11-01 ENCOUNTER — Other Ambulatory Visit: Payer: Self-pay

## 2018-11-01 ENCOUNTER — Telehealth: Payer: Self-pay | Admitting: Pharmacist

## 2018-11-01 DIAGNOSIS — E119 Type 2 diabetes mellitus without complications: Secondary | ICD-10-CM

## 2018-11-01 MED ORDER — BASAGLAR KWIKPEN 100 UNIT/ML ~~LOC~~ SOPN: 24.0000 [IU] | PEN_INJECTOR | SUBCUTANEOUS | 1 refills | Status: DC

## 2018-11-01 MED ORDER — ONETOUCH ULTRASOFT LANCETS MISC: 11 refills | Status: DC

## 2018-11-01 MED ORDER — GLUCOSE BLOOD VI STRP: ORAL_STRIP | 11 refills | Status: DC

## 2018-11-01 NOTE — Telephone Encounter (Signed)
Attempted to reach patient to discuss UTI symptoms. No answer. No voicemail set up so could not LVM. If patient calls back and wants to discuss UTI symptoms please let me know  Thanks Leeanne Rio, MD

## 2018-11-01 NOTE — Telephone Encounter (Signed)
Reviewed and agree.

## 2018-11-01 NOTE — Telephone Encounter (Signed)
Patient reports blood sugars are much improved - CBG fasting yesterday 179 and this AM 217.  States symptom of nocturia (now once per night) and increased thirst have improved.   States she is concerned that she has a UTI - reports burning and pain with urination.  Referred to Dr. Ardelia Mems RE urinary Sx.   Appears Basaglar is preferred insulin - advised to take 24 units tomorrow AM and continue 1 unit per day titration.  New prescription for Basaglar, lancets and strips sent to pharmacy.

## 2018-11-02 ENCOUNTER — Encounter: Payer: Self-pay | Admitting: Family Medicine

## 2018-11-02 ENCOUNTER — Other Ambulatory Visit: Payer: Self-pay

## 2018-11-02 ENCOUNTER — Telehealth (INDEPENDENT_AMBULATORY_CARE_PROVIDER_SITE_OTHER): Payer: 59 | Admitting: Family Medicine

## 2018-11-02 ENCOUNTER — Telehealth: Payer: 59 | Admitting: Family Medicine

## 2018-11-02 DIAGNOSIS — R399 Unspecified symptoms and signs involving the genitourinary system: Secondary | ICD-10-CM | POA: Diagnosis not present

## 2018-11-02 MED ORDER — NITROFURANTOIN MONOHYD MACRO 100 MG PO CAPS: 100.0000 mg | ORAL_CAPSULE | Freq: Two times a day (BID) | ORAL | 0 refills | Status: AC

## 2018-11-02 NOTE — Progress Notes (Signed)
Patient ID: Annette Cabrera, female   DOB: 1961-01-16, 58 y.o.   MRN: [id number] [facility] Tele[facility] Visit  Patient consented to have virtual visit. Method of visit: Video visit initiated, however, due to technical difficulty, video call was terminated and we transition to a phone call visit.  Encounter participants: Patient: Annette Cabrera - located at home Provider: Andrena Mews - located at home Others (if applicable): NA  Chief Complaint: UTI symptoms  HPI:  Urinary Tract Infection   This is a new problem. Episode onset: 2 days ago. The problem occurs every urination. The problem has been unchanged. The quality of the pain is described as burning. The pain is at a severity of 5/10. There has been no fever. Associated symptoms include frequency and urgency. Pertinent negatives include no discharge, flank pain, hematuria, nausea or possible pregnancy. She has tried increased fluids (AZO) for the symptoms. The treatment provided mild relief. There is no history of recurrent UTIs.     ROS: per HPI  Pertinent PMHx: Problem list reviewed  Exam:  Respiratory: No distress  Assessment/Plan: UTI symptoms. Treat empirically with Nitrofurantoin. Return soon if there is no improvement for UA and urine culture. She verbalized understanding and agreed with the plan.  Time spent during visit with patient: 15 minutes

## 2018-11-02 NOTE — Telephone Encounter (Signed)
Pt returned call about UTI symptoms.  Virtual appt scheduled with Dr. Owens Shark for this afternoon.  Christen Bame, CMA

## 2018-11-08 ENCOUNTER — Other Ambulatory Visit: Payer: Self-pay | Admitting: *Deleted

## 2018-11-08 MED ORDER — PEN NEEDLES 29G X 12MM MISC: 3 refills | Status: DC

## 2018-11-24 ENCOUNTER — Other Ambulatory Visit: Payer: Self-pay | Admitting: Family Medicine

## 2018-11-24 DIAGNOSIS — E119 Type 2 diabetes mellitus without complications: Secondary | ICD-10-CM

## 2018-11-26 NOTE — Telephone Encounter (Signed)
Will refill, please have patient schedule virtual office visit to follow up on how her diabetes is doing Thanks Leeanne Rio, MD

## 2018-11-27 ENCOUNTER — Other Ambulatory Visit: Payer: Self-pay

## 2018-11-27 ENCOUNTER — Telehealth (INDEPENDENT_AMBULATORY_CARE_PROVIDER_SITE_OTHER): Payer: 59 | Admitting: Family Medicine

## 2018-11-27 DIAGNOSIS — J4520 Mild intermittent asthma, uncomplicated: Secondary | ICD-10-CM

## 2018-11-27 DIAGNOSIS — E119 Type 2 diabetes mellitus without complications: Secondary | ICD-10-CM

## 2018-11-27 DIAGNOSIS — I10 Essential (primary) hypertension: Secondary | ICD-10-CM | POA: Diagnosis not present

## 2018-11-27 DIAGNOSIS — R739 Hyperglycemia, unspecified: Secondary | ICD-10-CM

## 2018-11-27 DIAGNOSIS — M25571 Pain in right ankle and joints of right foot: Secondary | ICD-10-CM

## 2018-11-27 MED ORDER — ALBUTEROL SULFATE HFA 108 (90 BASE) MCG/ACT IN AERS: 2.0000 | INHALATION_SPRAY | Freq: Four times a day (QID) | RESPIRATORY_TRACT | 3 refills | Status: DC | PRN

## 2018-11-27 NOTE — Progress Notes (Signed)
[facility] Telemedicine Visit  Patient consented to have virtual visit. Method of visit: Telephone  Encounter participants: Patient: Annette Cabrera - located at home Provider: Chrisandra Netters - located at office Others (if applicable): n/a  Chief Complaint: follow up on diabetes   HPI:  Diabetes - currently taking basaglar 24 units daily. She was uptitrating at Dr. Graylin Shiver instruciton and had gotten as high as 28 units per day, but when her medication was refilled and the instructions said 24 units per day, she went back down to 24 u/day starting about 2-3 weeks ago. Average morning blood sugars are in the 150s. She is also taking metformin XR 500mg  twice daily.   Blurry vision - Thinks vision was affected when blood sugar went up. Has history of needing reading glasses. Since the end of April has had generally blurry vision. Has appointment with eye doctor on June 24. They had earlier appts but she needed that appointment due to her work schedule.  Hypertension - currently taking amlodipine 10mg  daily, HCTZ 25mg  daily, and lisinopril 20mg  daily. No chest pain or shortness of breath. Does not check blood pressure at home.  Ankle pain - has pain in area of achilles tendon on the R side. Patient thought this was plantar fasciitis. It is keeping her from being able to exercise.  Asthma - needs albuterol inhaler refilled. Uses it rarely.   ROS: per HPI  Pertinent PMHx: history of hypertension, menieres, hyperlipidemia, diabetes, asthma  Exam:  Respiratory: Patient speaking normally in full sentences throughout the encounter, without any respiratory distress evident.   Assessment/Plan:  Diabetes type 2, controlled ([facility]) Fasting sugars improving with insulin. Continue uptitration for AM fasting sugars >120. Follow up in 1 month via virtual visit, in 2 months in-office for A1c  Hypertension Well controlled at last visit, compliant with medications. Check  in-office in 2 months.   Asthma Refill albuterol for as needed use, uses rarely   Blurry vision - unable to assess over phone visit. Advised she needs to be seen by eye doctor; encouraged her to move appointment up sooner.   Pain at achilles tendon - patient desires referral to sports medicine, will enter referral.  Time spent during visit with patient: 14 minutes

## 2018-11-27 NOTE — Assessment & Plan Note (Signed)
Fasting sugars improving with insulin. Continue uptitration for AM fasting sugars >120. Follow up in 1 month via virtual visit, in 2 months in-office for A1c

## 2018-11-27 NOTE — Assessment & Plan Note (Signed)
Well controlled at last visit, compliant with medications. Check in-office in 2 months.

## 2018-11-27 NOTE — Assessment & Plan Note (Signed)
Refill albuterol for as needed use, uses rarely

## 2018-12-06 ENCOUNTER — Encounter: Payer: Self-pay | Admitting: Sports Medicine

## 2018-12-06 ENCOUNTER — Other Ambulatory Visit: Payer: Self-pay

## 2018-12-06 ENCOUNTER — Ambulatory Visit: Payer: Self-pay

## 2018-12-06 ENCOUNTER — Ambulatory Visit (INDEPENDENT_AMBULATORY_CARE_PROVIDER_SITE_OTHER): Payer: 59 | Admitting: Sports Medicine

## 2018-12-06 ENCOUNTER — Ambulatory Visit: Payer: 59 | Admitting: Sports Medicine

## 2018-12-06 VITALS — BP 126/86 | Ht 63.0 in | Wt 250.0 lb

## 2018-12-06 DIAGNOSIS — M79671 Pain in right foot: Secondary | ICD-10-CM

## 2018-12-06 DIAGNOSIS — M7751 Other enthesopathy of right foot: Secondary | ICD-10-CM | POA: Diagnosis not present

## 2018-12-06 DIAGNOSIS — M6788 Other specified disorders of synovium and tendon, other site: Secondary | ICD-10-CM | POA: Diagnosis not present

## 2018-12-06 NOTE — Assessment & Plan Note (Signed)
This condition will also improve with physical therapy.  The management will be the same as above.

## 2018-12-06 NOTE — Patient Instructions (Signed)
It was nice meeting you today Annette Cabrera!  Your heel pain is being caused by calcific Achilles tendinopathy and retrocalcaneal bursitis.  This is an issue that will slowly recover, but it will likely take several months.  In order to increase the speed of your recovery, we are recommending that you wear heel lifts with your tennis shoes and other flat shoes and ice your heel for about 10 minutes after you are on your feet for a while.  Please try to keep exercising, but let pain be your guide in terms of how long you exercise.  If you can use a stationary bike or swim, this is preferable.  We have placed a referral for physical therapy today, which will be very important in helping you recuperate.  We hope that you feel better soon and we will see you back in 6-8 weeks.  If you have any questions or concerns, please feel free to call the clinic.   Be well,  Dr. Micheline Chapman

## 2018-12-06 NOTE — Progress Notes (Signed)
Subjective:    Annette Cabrera - 58 y.o. female MRN [id number]  Date of birth: [birth year]  CC:  Annette Cabrera is here for right posterior ankle pain.  HPI: Annette Cabrera reports that she fell about 2 years ago, and since then, her right posterior ankle has been bothering her.  She says that this is slowly gotten worse and now hurts every time she walks on it, especially if she is wearing flat shoes.  She says that she was recently in the grocery store and wearing tennis shoes, and she was almost in tears due to the pain.  She will sometimes feel this pain at night when she is lying in bed as well.  She will occasionally take Tylenol and ibuprofen, which provide some relief.  Wearing a heeled shoe is more comfortable for her.  She finds that the area of her distal Achilles tendon is sometimes swollen and will sometimes feel warm.  She denies any numbness or tingling of the foot or any feeling of instability.  Health Maintenance:  Health Maintenance Due  Topic Date Due  . OPHTHALMOLOGY EXAM  11/13/2018    -  reports that she has never smoked. She has never used smokeless tobacco. - Review of Systems: Per HPI. - Past Medical History: Patient Active Problem List   Diagnosis Date Noted  . Retrocalcaneal bursitis (back of heel), right 12/06/2018  . Achilles tendonosis of right lower extremity 12/06/2018  . Perineal irritation 01/31/2018  . OSA (obstructive sleep apnea) 05/10/2017  . Shoulder pain 04/21/2017  . Vitamin D deficiency 04/21/2017  . Morbid obesity ([place]) 04/03/2017  . DOE (dyspnea on exertion) 04/03/2017  . Hot flashes 03/24/2017  . Tachycardia 03/24/2017  . Plantar fasciitis 03/24/2017  . Sleep difficulties 05/10/2016  . GERD (gastroesophageal reflux disease) 05/10/2016  . Asthma 05/10/2016  . Hyperlipidemia 05/09/2016  . Diabetes type 2, controlled ([place]) 09/13/2015  . Meniere's disease 09/16/2013  . Allergic rhinitis 05/30/2013  . Hypertension 04/24/2013   -  Medications: reviewed and updated   Objective:   Physical Exam BP 126/86   Ht 5\' 3"  (1.6 m)   Wt 250 lb (113.4 kg)   BMI 44.29 kg/m  Gen: NAD, alert, cooperative with exam, well-appearing, pleasant, morbidly obese Musculoskeletal: Right ankle: - Inspection: No obvious deformity, erythema, swelling, or ecchymosis - Palpation: Tenderness to palpation over the distal Achilles tendon at the insertion site, warmth palpated in this area, Negative [place] ankle exam - Strength: Normal strength with dorsiflexion, plantarflexion, inversion, and eversion - ROM: Full ROM - Neuro/vasc: NV intact - Special Tests: Negative syndesmotic compression.  Plantarflexion noted on Thompson's test. Left ankle: - Inspection: No obvious deformity, erythema, swelling, or ecchymosis - Palpation: No TTP over bony or tendinous or ligamentous landmarks, Negative [place] ankle exam - Strength: Normal strength with dorsiflexion, plantarflexion, inversion, and eversion - ROM: Full ROM - Neuro/vasc: NV intact - Special Tests:  Negative syndesmotic compression. Feet: no deformity noted.  Normal arch, normal arch flexibility.  Normal calcaneal motion with toe-raise. Gait: Antalgic, favoring the right side Skin: no rashes, normal turgor, no signs of infection, no erythema Neuro: no gross deficits.  Psych: good insight, alert and oriented  Right Achilles tendon ultrasound, limited images obtained in long and short axis  Hyperechoic areas visualized within the distal Achilles tendon near the insertion site, signifying calcific changes.  Fluid is visualized in the retrocalcaneal bursa, signifying retrocalcaneal bursitis.  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    Assessment & Plan:   Achilles tendonosis of right lower extremity History, physical exam, and ultrasound results support the diagnosis of calcific Achilles tendinosis.  The  affected area is at the insertion site of the Achilles tendon.  Patient was advised that this will likely take several months to resolve.  She was given heel inserts today and advised to place these in any flat shoes that she wears.  She was also counseled to ice the area after she is on her feet for a while.  She was given a prescription for physical therapy and told that this is the most important method of resolving this condition.  She was encouraged to continue to exercise to lose weight, but told that she may have better success on a stationary bike instead of walking due to her pain.  Retrocalcaneal bursitis (back of heel), right This condition will also improve with physical therapy.  The management will be the same as above.    Annette Cabrera, M.D. 12/06/2018, 9:08 AM PGY-2, [facility]  Patient seen and evaluated with the resident.  I agree with the above plan of care.  Patient has evidence of chronic insertional Achilles tendinopathy.  This is a very difficult condition to treat.  I think she would benefit from formal physical therapy.  I have also given her some heel lifts to wear in her shoes.  Follow-up with me again in 6 to 8 weeks.  Call with questions or concerns in the interim.

## 2018-12-06 NOTE — Assessment & Plan Note (Signed)
History, physical exam, and ultrasound results support the diagnosis of calcific Achilles tendinosis.  The affected area is at the insertion site of the Achilles tendon.  Patient was advised that this will likely take several months to resolve.  She was given heel inserts today and advised to place these in any flat shoes that she wears.  She was also counseled to ice the area after she is on her feet for a while.  She was given a prescription for physical therapy and told that this is the most important method of resolving this condition.  She was encouraged to continue to exercise to lose weight, but told that she may have better success on a stationary bike instead of walking due to her pain.

## 2018-12-13 LAB — HM DIABETES EYE EXAM

## 2018-12-14 ENCOUNTER — Telehealth: Payer: Self-pay | Admitting: Family Medicine

## 2018-12-14 NOTE — Telephone Encounter (Signed)
Pt called and stated that she will come into office on Monday 06-29 to fill out new pt packet for her daughter in the car and will bring it back into the office.

## 2018-12-16 ENCOUNTER — Other Ambulatory Visit: Payer: Self-pay | Admitting: Family Medicine

## 2018-12-16 DIAGNOSIS — E119 Type 2 diabetes mellitus without complications: Secondary | ICD-10-CM

## 2018-12-19 ENCOUNTER — Telehealth: Payer: Self-pay | Admitting: Family Medicine

## 2018-12-19 NOTE — Telephone Encounter (Signed)
Pt's son came into office for FMLA paperwork to be completed by her PCP. Pt's last visit was on 12-06-18. Forms were placed in red team folder.

## 2018-12-19 NOTE — Telephone Encounter (Signed)
Reviewed FMLA paperwork and placed in PCP's box for completion.  .Michelle R Simpson, CMA' 

## 2018-12-21 ENCOUNTER — Other Ambulatory Visit: Payer: Self-pay | Admitting: Family Medicine

## 2018-12-24 NOTE — Telephone Encounter (Signed)
FMLA forms completed. Will return to RN team for review.  Red team, can you contact patient and ask if she initiated the request I received for CPAP supplies from [facility]? I have been receiving frequent faxed requests from them.  Thanks Leeanne Rio, MD

## 2018-12-24 NOTE — Telephone Encounter (Signed)
Forms faxed as requested.  Original placed up front for pick up and copy placed in batch scanning. Christen Bame, CMA

## 2018-12-24 NOTE — Telephone Encounter (Signed)
Spoke to patient and she has not requested any supplies for her CPAP in a while.  She is not sure why Dr. Ardelia Mems is receiving request.  Patient needs FMLA paperwork faxed and states that the number is on the paperwork.  Patient states that she needs a refill on Metformin. Request has been submitted to PCP.  Ozella Almond, [place]

## 2018-12-27 ENCOUNTER — Encounter: Payer: Self-pay | Admitting: Family Medicine

## 2018-12-31 NOTE — Telephone Encounter (Signed)
Pt called back.  Page 5 of 5 was signed but not dated.   Found form, added date (with my initials and todays date) and refaxed to [phone]. Christen Bame, CMA

## 2019-01-08 ENCOUNTER — Telehealth: Payer: Self-pay | Admitting: Family Medicine

## 2019-01-08 NOTE — Telephone Encounter (Signed)
Please fax patient's med list to [phone].

## 2019-01-17 ENCOUNTER — Ambulatory Visit: Payer: 59 | Admitting: Sports Medicine

## 2019-02-07 ENCOUNTER — Telehealth (INDEPENDENT_AMBULATORY_CARE_PROVIDER_SITE_OTHER): Payer: 59 | Admitting: Family Medicine

## 2019-02-07 ENCOUNTER — Other Ambulatory Visit: Payer: Self-pay

## 2019-02-07 DIAGNOSIS — J0100 Acute maxillary sinusitis, unspecified: Secondary | ICD-10-CM | POA: Diagnosis not present

## 2019-02-07 IMAGING — NM NM MISC PROCEDURE
6 series · 36 of 36 positions shown · non-contrast
Comparison: none

[Series 1: wbr_s-proj_st stress-sum-em · 6.40mm/px · 6 of 64 frames shown]
[frame 6/64]
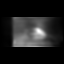
[frame 16/64]
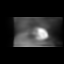
[frame 27/64]
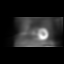
[frame 38/64]
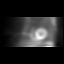
[frame 48/64]
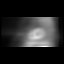
[frame 59/64]
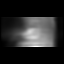

[Series 1: stress-gsp · 6.40mm/px · 6 of 510 frames shown]
[frame 43/510  full-range]
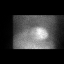
[frame 128/510  full-range]
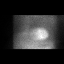
[frame 213/510  full-range]
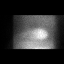
[frame 298/510  full-range]
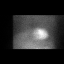
[frame 383/510  full-range]
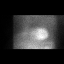
[frame 468/510  full-range]
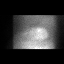

[Series 1: wbr_r-proj_st rest · 6.40mm/px · 6 of 64 frames shown]
[frame 6/64]
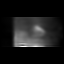
[frame 16/64]
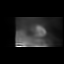
[frame 27/64]
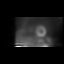
[frame 38/64]
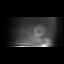
[frame 48/64]
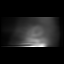
[frame 59/64]
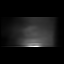

[Series 1: rest · 6.40mm/px · 6 of 64 frames shown]
[frame 6/64]
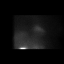
[frame 16/64]
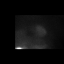
[frame 27/64]
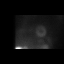
[frame 38/64]
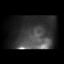
[frame 48/64]
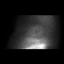
[frame 59/64]
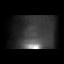

[Series 1: stress-sum-em · 6.40mm/px · 6 of 64 frames shown]
[frame 6/64]
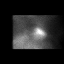
[frame 16/64]
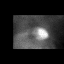
[frame 27/64]
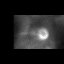
[frame 38/64]
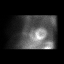
[frame 48/64]
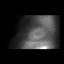
[frame 59/64]
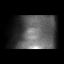

[Series 1: wbr_s-proj_st stress-gsp · 6.40mm/px · 6 of 512 frames shown]
[frame 43/512]
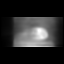
[frame 128/512]
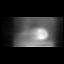
[frame 214/512]
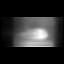
[frame 299/512]
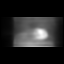
[frame 384/512]
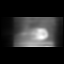
[frame 470/512]
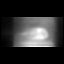

[36 of 36 positions shown; findings below may reference images not displayed]

Canned report from images found in remote index.

Refer to host system for actual result text.

## 2019-02-07 MED ORDER — DOXYCYCLINE HYCLATE 100 MG PO TABS: 100.0000 mg | ORAL_TABLET | Freq: Two times a day (BID) | ORAL | 0 refills | Status: DC

## 2019-02-07 NOTE — Assessment & Plan Note (Signed)
Due to the severity of patient's pain, length of infection, and her concurrent poorly controlled type 2 diabetes, will treat as bacterial sinusitis.  Counseled patient that many sinus infections are actually viral, but she does have risk factors for bacterial disease, so we will treat with doxycycline 100 mg twice daily for 10 days since she is allergic to penicillins.  There is also a possibility for her to have more concerning infection such as mucormycosis given her diagnosis of diabetes, so I counseled her to let us know if she does not improve over the next week.  Also counseled her to go to an urgent care in [place] since this is currently where she is rather than wait to be seen here if she has any concerning symptoms.

## 2019-02-07 NOTE — Progress Notes (Signed)
[place] Telemedicine Visit  Patient consented to have virtual visit. Method of visit: Telephone  Encounter participants: Patient: Annette Cabrera - located at daughter's home Provider: Kathrene Alu - located at [facility] Others (if applicable): none  Chief Complaint: sinus pain  HPI:  Started about 10 days ago No fevers but subjective fevers with chills L side>R side Tooth pain that has kept her from eating much Has tried neti pot, OTC sinus meds Ibuprofen 200 mg temporarily helps  Symptoms include stuffy nose, headache, but no sore throat or cough  ROS: per HPI  Pertinent PMHx: Poorly controlled type 2 diabetes, hypertension  Exam:  Respiratory: Able to speak in full sentences without shortness of breath  Assessment/Plan:  Acute non-recurrent maxillary sinusitis Due to the severity of patient's pain, length of infection, and her concurrent poorly controlled type 2 diabetes, will treat as bacterial sinusitis.  Counseled patient that many sinus infections are actually viral, but she does have risk factors for bacterial disease, so we will treat with doxycycline 100 mg twice daily for 10 days since she is allergic to penicillins.  There is also a possibility for her to have more concerning infection such as mucormycosis given her diagnosis of diabetes, so I counseled her to let us know if she does not improve over the next week.  Also counseled her to go to an urgent care in [place] since this is currently where she is rather than wait to be seen here if she has any concerning symptoms.    Time spent during visit with patient: 8 minutes

## 2019-02-19 ENCOUNTER — Other Ambulatory Visit: Payer: Self-pay | Admitting: Family Medicine

## 2019-03-12 ENCOUNTER — Ambulatory Visit: Payer: 59 | Admitting: Sports Medicine

## 2019-03-13 ENCOUNTER — Other Ambulatory Visit: Payer: Self-pay | Admitting: Family Medicine

## 2019-03-20 MED ORDER — HYDROCHLOROTHIAZIDE 25 MG PO TABS: 25.0000 mg | ORAL_TABLET | Freq: Every day | ORAL | 0 refills | Status: DC

## 2019-03-20 NOTE — Telephone Encounter (Signed)
Red team please ask patient to follow up with me in person  Thanks Leeanne Rio, MD

## 2019-03-21 NOTE — Telephone Encounter (Signed)
Pt informed and scheduled for an appt. Noele Icenhour, CMA  

## 2019-04-12 ENCOUNTER — Other Ambulatory Visit: Payer: Self-pay

## 2019-04-12 ENCOUNTER — Ambulatory Visit (INDEPENDENT_AMBULATORY_CARE_PROVIDER_SITE_OTHER): Payer: 59 | Admitting: Family Medicine

## 2019-04-12 ENCOUNTER — Encounter: Payer: Self-pay | Admitting: Family Medicine

## 2019-04-12 VITALS — BP 136/82 | HR 93 | Wt 260.0 lb

## 2019-04-12 DIAGNOSIS — E782 Mixed hyperlipidemia: Secondary | ICD-10-CM | POA: Diagnosis not present

## 2019-04-12 DIAGNOSIS — J4520 Mild intermittent asthma, uncomplicated: Secondary | ICD-10-CM

## 2019-04-12 DIAGNOSIS — Z1231 Encounter for screening mammogram for malignant neoplasm of breast: Secondary | ICD-10-CM | POA: Diagnosis not present

## 2019-04-12 DIAGNOSIS — I10 Essential (primary) hypertension: Secondary | ICD-10-CM

## 2019-04-12 DIAGNOSIS — E119 Type 2 diabetes mellitus without complications: Secondary | ICD-10-CM | POA: Diagnosis not present

## 2019-04-12 DIAGNOSIS — Z23 Encounter for immunization: Secondary | ICD-10-CM

## 2019-04-12 LAB — POCT GLYCOSYLATED HEMOGLOBIN (HGB A1C): HbA1c, POC (controlled diabetic range): 6.2 % (ref 0.0–7.0)

## 2019-04-12 NOTE — Assessment & Plan Note (Signed)
Well-controlled.  Continue current regimen. 

## 2019-04-12 NOTE — Assessment & Plan Note (Signed)
Excellent control with A1c of 6.2. continue current regimen. Update lipids & CMET today.

## 2019-04-12 NOTE — Progress Notes (Signed)
Date of Visit: 04/12/2019   HPI:  Patient presents for routine follow up.  Diabetes - taking basaglar 32 units daily and metformin 1000mg  twice daily. Does not think she is getting low sugars, has been busy with new grandson so hasn't checked sugars much lately. Asks about metformin recalls  Hypertension - currently taking HCTZ 25mg  daily, amlodipine 10mg  daily, and lisinopril 20mg  daily. No chest pain or shortness of breath.   Asthma - using albuterol inhaler as needed, just occasionally, does not need very often  Bump on roof of mouth - noticed a bump on the roof of her mouth around the time she recently had to get antibiotics for a sinus infection. Is overall getting smaller and improving. Not painful. Also has bump on buccal side of her upper L gum line. Also not painful, that one has been present for about 2 weeks. Has upcoming appointment with dentist next week.  ROS: See HPI.  [place]: history of type 2 diabetes, hyperlipidemia, hypertension, menieres disease  PHYSICAL EXAM: BP 136/82   Pulse 93   Wt 260 lb (117.9 kg)   SpO2 98%   BMI 46.06 kg/m  Gen: no acute distress, pleasant, cooperative HEENT: normocephalic, atraumatic, moist mucous membranes. +small 0.5cm nontender slightly boggy area on roof of mouth (would never have seen or felt if patient had not pointed it out), small ~30mm pink round tissue growth on L upper gum line on buccal side. No anterior cervical or supraclavicular lymphadenopathy.  Heart: regular rate and rhythm, no murmur Lungs: clear to auscultation bilaterally, normal work of breathing  Neuro: alert, grossly nonfocal, speech normal  ASSESSMENT/PLAN:  Health maintenance:  -mammogram ordered -flu shot given today -otherwise UTD on HM items  Advised she contact her pharmacy to see if her metformin was among the recalled lots.  Diabetes type 2, controlled ([place]) Excellent control with A1c of 6.2. continue current regimen. Update lipids & CMET  today.  Hyperlipidemia Chronic issue. Checking lipids & CMET today.  Hypertension Well controlled. Continue current regimen.   Asthma Well controlled, only needing occasional albuterol   Sores in mouth New onset, roof of mouth sore is improving. Area on gums is new, may represent canker sore or benign mouth sore. She has upcoming appointment with dentist next week. Advised if they do not get better she needs to let me know and we will send her to ENT  FOLLOW UP: Follow up in 3 mos for diabetes   [place] J. Ardelia Mems, [place]

## 2019-04-12 NOTE — Assessment & Plan Note (Signed)
Well controlled, only needing occasional albuterol

## 2019-04-12 NOTE — Assessment & Plan Note (Signed)
Chronic issue. Checking lipids & CMET today.

## 2019-04-12 NOTE — Patient Instructions (Signed)
It was great to see you again today!  A1c looks awesome. Keep up the good work! Stay on current medications  Checking kidneys and liver today.  Flu shot today  See the handout on how to schedule your mammogram. This is an important test to screen for breast cancer.   Follow up in 3 months.  Be well, Dr. Ardelia Mems

## 2019-04-13 LAB — LIPID PANEL
Chol/HDL Ratio: 2.4 ratio (ref 0.0–4.4)
Cholesterol, Total: 129 mg/dL (ref 100–199)
HDL: 54 mg/dL (ref >39)
LDL Chol Calc (NIH): 52 mg/dL (ref 0–99)
Triglycerides: 136 mg/dL (ref 0–149)
VLDL Cholesterol Cal: 23 mg/dL (ref 5–40)

## 2019-04-13 LAB — CMP14+EGFR
ALT: 12 IU/L (ref 0–32)
AST: 12 IU/L (ref 0–40)
Albumin/Globulin Ratio: 1.7 (ref 1.2–2.2)
Albumin: 4.5 g/dL (ref 3.8–4.9)
Alkaline Phosphatase: 103 IU/L (ref 39–117)
BUN/Creatinine Ratio: 14 (ref 9–23)
BUN: 9 mg/dL (ref 6–24)
Bilirubin Total: 1.4 mg/dL — ABNORMAL HIGH (ref 0.0–1.2)
CO2: 26 mmol/L (ref 20–29)
Calcium: 9.7 mg/dL (ref 8.7–10.2)
Chloride: 101 mmol/L (ref 96–106)
Creatinine, Ser: 0.65 mg/dL (ref 0.57–1.00)
GFR calc Af Amer: 114 mL/min/{1.73_m2} (ref >59)
GFR calc non Af Amer: 99 mL/min/{1.73_m2} (ref >59)
Globulin, Total: 2.6 g/dL (ref 1.5–4.5)
Glucose: 88 mg/dL (ref 65–99)
Potassium: 3.8 mmol/L (ref 3.5–5.2)
Sodium: 142 mmol/L (ref 134–144)
Total Protein: 7.1 g/dL (ref 6.0–8.5)

## 2019-04-22 ENCOUNTER — Encounter: Payer: Self-pay | Admitting: Family Medicine

## 2019-05-29 ENCOUNTER — Ambulatory Visit
Admission: RE | Admit: 2019-05-29 | Discharge: 2019-05-29 | Disposition: A | Discharge status: Home / Self Care | Payer: 59 | Source: Ambulatory Visit | Attending: Family Medicine | Admitting: Family Medicine

## 2019-05-29 ENCOUNTER — Other Ambulatory Visit: Payer: Self-pay

## 2019-05-29 DIAGNOSIS — Z1231 Encounter for screening mammogram for malignant neoplasm of breast: Secondary | ICD-10-CM

## 2019-06-10 ENCOUNTER — Other Ambulatory Visit: Payer: Self-pay | Admitting: Family Medicine

## 2019-06-19 ENCOUNTER — Other Ambulatory Visit: Payer: Self-pay | Admitting: Family Medicine

## 2019-08-16 ENCOUNTER — Encounter: Payer: Self-pay | Admitting: Family Medicine

## 2019-08-16 ENCOUNTER — Ambulatory Visit
Admission: RE | Admit: 2019-08-16 | Discharge: 2019-08-16 | Disposition: A | Discharge status: Home / Self Care | Payer: 59 | Source: Ambulatory Visit | Attending: Family Medicine | Admitting: Family Medicine

## 2019-08-16 ENCOUNTER — Other Ambulatory Visit: Payer: Self-pay

## 2019-08-16 ENCOUNTER — Ambulatory Visit (INDEPENDENT_AMBULATORY_CARE_PROVIDER_SITE_OTHER): Payer: 59 | Admitting: Family Medicine

## 2019-08-16 VITALS — BP 130/80 | HR 108 | Wt 260.6 lb

## 2019-08-16 DIAGNOSIS — E119 Type 2 diabetes mellitus without complications: Secondary | ICD-10-CM | POA: Diagnosis not present

## 2019-08-16 DIAGNOSIS — M25562 Pain in left knee: Secondary | ICD-10-CM

## 2019-08-16 DIAGNOSIS — R17 Unspecified jaundice: Secondary | ICD-10-CM | POA: Diagnosis not present

## 2019-08-16 DIAGNOSIS — G8929 Other chronic pain: Secondary | ICD-10-CM

## 2019-08-16 DIAGNOSIS — J3489 Other specified disorders of nose and nasal sinuses: Secondary | ICD-10-CM | POA: Diagnosis not present

## 2019-08-16 DIAGNOSIS — H8109 Meniere's disease, unspecified ear: Secondary | ICD-10-CM

## 2019-08-16 LAB — POCT GLYCOSYLATED HEMOGLOBIN (HGB A1C): HbA1c, POC (controlled diabetic range): 6.3 % (ref 0.0–7.0)

## 2019-08-16 MED ORDER — DOXYCYCLINE HYCLATE 100 MG PO TABS: 100.0000 mg | ORAL_TABLET | Freq: Two times a day (BID) | ORAL | 0 refills | Status: DC

## 2019-08-16 NOTE — Assessment & Plan Note (Signed)
Exam suspicious for meniscal pathology. This is actually not her most pressing issue today (vertigo is) Check xray, follow up closely. Anticipate will likely need MRI.

## 2019-08-16 NOTE — Assessment & Plan Note (Signed)
Recent significant dizziness. Unclear if this is related to the lesion in her mouth. Will check CT maxillofacial to evaluate facial bones/sinuses and refer to ENT. Patient prefers to see different provider than she did in the past (avoid Dr. Janeice Robinson office). Advised to schedule follow up with me in a few weeks.

## 2019-08-16 NOTE — Progress Notes (Signed)
Date of Visit: 08/16/2019   HPI:  Annette Cabrera presents today for follow up and to discuss several issues.  Diabetes - currently taking basaglar 30 units daily along with metformin 1000mg  twice daily. Tolerating well. Does not check sugars often but does not think she has had any low sugars.  Knee pain - has had pain in L knee since Thanksgiving. [place] on Christmas Eve and is still having pain from then. Knee was swollen but has come down some. Has pain pretty much all throughout the knee. Has trouble bending it.  Has not seen a doctor for this before.  Bump in mouth - has a lesion on the front of her gum above upper left central incisor. Also feels fullness in the roof of her mouth behind her front teeth. Is able to push on the roof of her mouth with her tongue, and the lesion on top front of her gum then expresses milky purulent substance. Denies fever. Says she was seen by a dentist and had a root canal in December but otherwise dentist doesn't see a problem. This has gone on for some time (many months). Treated previously with doxycycline for sinusitis with some transient improvement.  Dizziness - history of menieres disease for years in the past, and seen previously by ENT here locally but reports that she was told by that provider twice that she doesn't seem to have classic menieres disease. In any case, since after Thanksgiving she has had significant symptoms of dizziness and pressure in her right ear. Has lots of spinning with any movement of her head or eyes. Significantly limiting her daily activities. Feels nauseated. Feels decreased hearing in her R ear. No fevers. R side of head feels blocked.  PHYSICAL EXAM: BP 130/80   Pulse (!) 108   Wt 260 lb 9.6 oz (118.2 kg)   SpO2 98%   BMI 46.16 kg/m  Gen: no acute distress, pleasant, cooperative HEENT: normocephalic, atraumatic. R tympanic membrane with mild fluid behind it, but no significant erythema or bulging. No anterior cervical or  supraclavicular lymphadenopathy. Oropharynx clear. No lesions visible on roof of mouth. On buccal side of gums above the front left central incisor there is a small hole. When patient pushes on roof of her mouth with tongue, milky discharge is expressed from the small hole. Heart: regular rate and rhythm, no murmur Lungs: clear to auscultation bilaterally, normal work of breathing  Neuro: alert, speech normal, grossly nonfocal. Significant vertiginous symptoms with movement of head and lying back. Unable to readily assess for nystagmus with lying back as patient kept eyes fairly tightly closed. Normal head impulse test.  Ext: No appreciable lower extremity edema bilaterally. L knee without effusion or warmth. mcl and lcl intact to varus and valgus stressing. Neg lachmans. + mcmurray with pain with knee flexion and axial loading.  ASSESSMENT/PLAN:  Meniere's disease Recent significant dizziness. Unclear if this is related to the lesion in her mouth. Will check CT maxillofacial to evaluate facial bones/sinuses and refer to ENT. Patient prefers to see different provider than she did in the past (avoid Dr. Janeice Robinson office). Advised to schedule follow up with me in a few weeks.   Diabetes type 2, controlled ([place]) At goal. No hypoglycemia. Stay on current regimen. Defer foot exam to next visit.  Left knee pain Exam suspicious for meniscal pathology. This is actually not her most pressing issue today (vertigo is) Check xray, follow up closely. Anticipate will likely need MRI.  Draining lesion of mouth Unclear  if sinus tract in her mouth. This is unusual and not something I have seen previously. Checking CT maxillofacial to evaluate. rx doxycline as she reports it helped with symptoms in the past (PCN allergy) Also referring to ENT.  Elevated bili Recheck bili today, previously mildly elevated  FOLLOW UP: Follow up in 2 weeks for above issues Urgent referral to ENT entered  [place] J.  Ardelia Mems, [place]

## 2019-08-16 NOTE — Assessment & Plan Note (Signed)
At goal. No hypoglycemia. Stay on current regimen. Defer foot exam to next visit.

## 2019-08-16 NOTE — Patient Instructions (Signed)
It was great to see you again today!  We talked about a lot of things  Checking lab today  Referring back to ENT Getting CT scan of your face/head  Go get knee xrays  Sent in antibiotics  Follow up with me in 2 weeks.  Be well, Dr. Ardelia Mems

## 2019-08-17 LAB — CMP14+EGFR
ALT: 16 IU/L (ref 0–32)
AST: 16 IU/L (ref 0–40)
Albumin/Globulin Ratio: 1.5 (ref 1.2–2.2)
Albumin: 4.4 g/dL (ref 3.8–4.9)
Alkaline Phosphatase: 117 IU/L (ref 39–117)
BUN/Creatinine Ratio: 18 (ref 9–23)
BUN: 13 mg/dL (ref 6–24)
Bilirubin Total: 1.4 mg/dL — ABNORMAL HIGH (ref 0.0–1.2)
CO2: 22 mmol/L (ref 20–29)
Calcium: 9.8 mg/dL (ref 8.7–10.2)
Chloride: 102 mmol/L (ref 96–106)
Creatinine, Ser: 0.73 mg/dL (ref 0.57–1.00)
GFR calc Af Amer: 106 mL/min/{1.73_m2} (ref >59)
GFR calc non Af Amer: 92 mL/min/{1.73_m2} (ref >59)
Globulin, Total: 2.9 g/dL (ref 1.5–4.5)
Glucose: 103 mg/dL — ABNORMAL HIGH (ref 65–99)
Potassium: 4.1 mmol/L (ref 3.5–5.2)
Sodium: 143 mmol/L (ref 134–144)
Total Protein: 7.3 g/dL (ref 6.0–8.5)

## 2019-08-21 ENCOUNTER — Telehealth: Payer: Self-pay

## 2019-08-21 DIAGNOSIS — J3489 Other specified disorders of nose and nasal sinuses: Secondary | ICD-10-CM

## 2019-08-21 NOTE — Telephone Encounter (Signed)
Patient LVM on nurse line stating she has an order for CT, however [facility] gave her a price of ~1400 for imaging. Patient stated she called [facility] and they can do the imaging for half the price. I called [facility] to clarify they can do test, they can. Please place new order for [facility] and I will get the patient scheduled.

## 2019-08-22 ENCOUNTER — Encounter: Payer: Self-pay | Admitting: Family Medicine

## 2019-08-22 NOTE — Telephone Encounter (Signed)
Patient contacted and informed she can call [facility] to scheduled. I cancelled [facility] apt.

## 2019-08-22 NOTE — Telephone Encounter (Signed)
Patient calling back to check status of changing order for location of CT.   Talbot Grumbling, RN

## 2019-08-22 NOTE — Telephone Encounter (Signed)
Placed new order for [facility]  Thanks Leeanne Rio, MD

## 2019-08-27 ENCOUNTER — Ambulatory Visit (HOSPITAL_COMMUNITY): Payer: 59

## 2019-08-30 ENCOUNTER — Telehealth: Payer: Self-pay | Admitting: Family Medicine

## 2019-08-30 NOTE — Telephone Encounter (Signed)
Reviewed FMLA form and placed in PCP's box for completion.  .Boneta Standre R Ryian Lynde, CMA  

## 2019-08-30 NOTE — Telephone Encounter (Signed)
Pt. Presented FMLA form for completions. Last appt. 08/16/19. Form placed in red team folder.

## 2019-09-06 ENCOUNTER — Ambulatory Visit
Admission: RE | Admit: 2019-09-06 | Discharge: 2019-09-06 | Disposition: A | Discharge status: Home / Self Care | Payer: 59 | Source: Ambulatory Visit | Attending: Family Medicine | Admitting: Family Medicine

## 2019-09-06 ENCOUNTER — Other Ambulatory Visit: Payer: Self-pay

## 2019-09-06 DIAGNOSIS — J3489 Other specified disorders of nose and nasal sinuses: Secondary | ICD-10-CM

## 2019-09-11 NOTE — Telephone Encounter (Addendum)
Attempted to reach patient to discuss. Called x2. No voicemail set up so I could not LVM. Need to discuss forms with patient before I can complete them.  Also wanted to review patient's CT scan results from this past Friday with her. No urgent findings.  I will be out of the office tomorrow and Friday, with Dr. Erin Hearing covering.  If patient needs FMLA papers completed before Monday of next week, please have Dr. Erin Hearing contact me and I will contact patient to discuss.  Leeanne Rio, MD

## 2019-09-13 NOTE — Telephone Encounter (Signed)
Patient calls nurse line returning phone call. Patient stated she is fine waiting until PCP is back next week. You can call her at your convenience.

## 2019-09-15 ENCOUNTER — Other Ambulatory Visit: Payer: Self-pay | Admitting: Family Medicine

## 2019-09-19 NOTE — Telephone Encounter (Signed)
Called patient & clarified. Completed forms. Will place in [facility] RN box Patient states she does not need a copy. Please fax forms in, and scan copy into chart  Also reviewed CT scan with her - has cyst likely related to dental issues. Advised seeing dentist, she has plans to do so. Was seen by ENT yesterday who are doing more testing on her.  Leeanne Rio, MD

## 2019-09-24 NOTE — Telephone Encounter (Signed)
FMLA forms faxed to appropriate number and copy made for batch scanning.

## 2019-10-27 ENCOUNTER — Other Ambulatory Visit: Payer: Self-pay | Admitting: Family Medicine

## 2019-11-07 ENCOUNTER — Other Ambulatory Visit: Payer: Self-pay | Admitting: Family Medicine

## 2019-12-20 ENCOUNTER — Other Ambulatory Visit: Payer: Self-pay | Admitting: Family Medicine

## 2019-12-20 NOTE — Telephone Encounter (Signed)
Please ask patient to schedule a follow up visit with me. She is due for a pap smear. Thanks Leeanne Rio, MD

## 2019-12-20 NOTE — Telephone Encounter (Signed)
Schedule is full for patient's availability.  Advised patient to call office later to see if August and September calendars are available.  Annette Cabrera, [place]

## 2019-12-22 ENCOUNTER — Other Ambulatory Visit: Payer: Self-pay | Admitting: Family Medicine

## 2020-02-11 ENCOUNTER — Other Ambulatory Visit: Payer: Self-pay | Admitting: Family Medicine

## 2020-02-11 DIAGNOSIS — J4520 Mild intermittent asthma, uncomplicated: Secondary | ICD-10-CM

## 2020-02-25 ENCOUNTER — Other Ambulatory Visit: Payer: Self-pay

## 2020-02-25 ENCOUNTER — Encounter: Payer: Self-pay | Admitting: Family Medicine

## 2020-02-25 ENCOUNTER — Other Ambulatory Visit (HOSPITAL_COMMUNITY)
Admission: RE | Admit: 2020-02-25 | Discharge: 2020-02-25 | Disposition: A | Discharge status: Home / Self Care | Payer: No Typology Code available for payment source | Source: Ambulatory Visit | Attending: Family Medicine | Admitting: Family Medicine

## 2020-02-25 ENCOUNTER — Ambulatory Visit (INDEPENDENT_AMBULATORY_CARE_PROVIDER_SITE_OTHER): Payer: No Typology Code available for payment source | Admitting: Family Medicine

## 2020-02-25 VITALS — BP 135/80 | HR 99 | Ht 62.0 in | Wt 265.2 lb

## 2020-02-25 DIAGNOSIS — Z23 Encounter for immunization: Secondary | ICD-10-CM | POA: Diagnosis not present

## 2020-02-25 DIAGNOSIS — R17 Unspecified jaundice: Secondary | ICD-10-CM

## 2020-02-25 DIAGNOSIS — Z124 Encounter for screening for malignant neoplasm of cervix: Secondary | ICD-10-CM | POA: Insufficient documentation

## 2020-02-25 DIAGNOSIS — M545 Low back pain, unspecified: Secondary | ICD-10-CM

## 2020-02-25 DIAGNOSIS — E119 Type 2 diabetes mellitus without complications: Secondary | ICD-10-CM

## 2020-02-25 DIAGNOSIS — R300 Dysuria: Secondary | ICD-10-CM | POA: Diagnosis not present

## 2020-02-25 LAB — POCT URINALYSIS DIP (MANUAL ENTRY)
Bilirubin, UA: NEGATIVE
Blood, UA: NEGATIVE
Glucose, UA: NEGATIVE mg/dL
Ketones, POC UA: NEGATIVE mg/dL
Leukocytes, UA: NEGATIVE
Nitrite, UA: NEGATIVE
Protein Ur, POC: NEGATIVE mg/dL
Spec Grav, UA: 1.025
Urobilinogen, UA: 0.2 U/dL
pH, UA: 5

## 2020-02-25 LAB — POCT GLYCOSYLATED HEMOGLOBIN (HGB A1C): HbA1c, POC (controlled diabetic range): 6.8 % (ref 0.0–7.0)

## 2020-02-25 NOTE — Patient Instructions (Signed)
It was great to see you again today!  Pap today  Get xray of your back. Will need MRI if xray is normal.  Checking labs today  Follow up with me separately to discuss arthritis  Flu shot today  Be well, Dr. Ardelia Mems

## 2020-02-25 NOTE — Progress Notes (Signed)
    SUBJECTIVE:   CHIEF COMPLAINT / HPI:   Lumbar Back Pain  Dysuria  Patient reports dysuria since June. Reports as sequence of predominantly night-time low back pain disrupting sleep, leading to feeling of urinary urgency upon standing. Pain is improved upon urination. She has been staying with her daughters, but describes the beds as comfortable. She is not sexually active. Denies fever, chills, vaginal itching,  change to vaginal discharge, burning when peeing. Denies saddle anesthesia, lower extremity weakness, or fecal or urinary incontinence.   DM Taking Basaglar 30 units daily along with metformin 1000 mg b.i.d. Patient reports indulging in a lot of popcorn throughout the pandemic. She does not check her glucose, but denies feeling lightheaded or dizzy.   Meniere's disease She reports resolution of dizziness and ear pressure.   Underwent surgery for cyst of upper mouth.   PERTINENT  PMH / PSH:  T2DM,  HTN, OSA, Allergic Rhinitis, Asthma, Meniere's disease, Morbid Obesity  OBJECTIVE:   BP 135/80   Pulse 99   Ht $R'5\' 2"'gx$  (1.575 m)   Wt 265 lb 3.2 oz (120.3 kg)   SpO2 96%   BMI 48.51 kg/m    General: Well appearing, no acute distress. Pleasant and cooperative.  Back: Paraspinal tenderness in lumbar region, with some midline tenderness as well. Full strenght bilateral lower extremities. Sensation intact over bilateral lower extremities.  Pelvic: Chaperone present. Vulva normal. Milky thin vaginal discharge. Cervix normal in appearance. No cervical motion tenderness or adnexal masses or tenderness. Diabetic foot exam: 2+ DP pulses bilat, normal monofilament testing bilaterally. No lesions or significant calluses.  Neuro: Alert and responsive, no focal deficits   ASSESSMENT/PLAN:   HM -pap smear today -Influenza vaccine today -Foot exam today -record requested from Eye exam at [facility] earlier this year (unclear if basic exam for glasses or diabetic eye exam)  Lumbar back Pain    Patient with months of lumbar back pain occurring predominantly at night; though without fever or chills, or weight loss. Has midline tenderness on exam, so warrants further evaluation. UA unremarkable, doubt UTI. -lumbar spine x-ray ordered. F/u with an MRI if inconclusive.  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 Check labs to confirm - fractionated bili, CBC, retic, peripheral smear. If all are unremarkable can feel reassured this is gilbert syndrome.   Diabetes type 2, controlled ([place]) Well controlled. A1C of 6.8. Continue on current regimine.   Note - patient also complained of feeling she has arthritis "everywhere". Given all of the many other issues we addressed during this visit, asked her to schedule separate visit to discuss that.  [place]   Patient seen along with MS3 student Romeo Apple. I personally evaluated this patient along with the student, and verified all aspects of the history, physical exam, and medical decision making as documented by the student. I agree with the student's documentation and have made all necessary edits.  Chrisandra Netters, MD  [place]

## 2020-02-25 NOTE — Progress Notes (Signed)
21 

## 2020-02-26 LAB — CBC
Hematocrit: 38.7 % (ref 34.0–46.6)
Hemoglobin: 13.1 g/dL (ref 11.1–15.9)
MCH: 28.7 pg (ref 26.6–33.0)
MCHC: 33.9 g/dL (ref 31.5–35.7)
MCV: 85 fL (ref 79–97)
Platelets: 324 10*3/uL (ref 150–450)
RBC: 4.56 x10E6/uL (ref 3.77–5.28)
RDW: 13.2 % (ref 11.7–15.4)
WBC: 8.1 10*3/uL (ref 3.4–10.8)

## 2020-02-26 LAB — CYTOLOGY - PAP
Adequacy: ABSENT
Chlamydia: NEGATIVE
Comment: NEGATIVE
Comment: NEGATIVE
Comment: NEGATIVE
Comment: NORMAL
Diagnosis: NEGATIVE
High risk HPV: NEGATIVE
Neisseria Gonorrhea: NEGATIVE
Trichomonas: NEGATIVE

## 2020-02-26 LAB — BILIRUBIN, FRACTIONATED(TOT/DIR/INDIR)
Bilirubin Total: 1.4 mg/dL — ABNORMAL HIGH (ref 0.0–1.2)
Bilirubin, Direct: 0.34 mg/dL (ref 0.00–0.40)
Bilirubin, Indirect: 1.06 mg/dL — ABNORMAL HIGH (ref 0.10–0.80)

## 2020-02-26 LAB — SEDIMENTATION RATE: Sed Rate: 18 mm/hr (ref 0–40)

## 2020-02-26 LAB — RETICULOCYTES: Retic Ct Pct: 1.4 % (ref 0.6–2.6)

## 2020-02-26 NOTE — Assessment & Plan Note (Signed)
Well controlled. A1C of 6.8. Continue on current regimine.

## 2020-02-28 ENCOUNTER — Encounter: Payer: Self-pay | Admitting: Family Medicine

## 2020-02-28 HISTORY — DX: Gilbert syndrome: E80.4

## 2020-02-28 LAB — PATHOLOGIST SMEAR REVIEW
Basophils Absolute: 0.1 10*3/uL (ref 0.0–0.2)
Basos: 1 %
EOS (ABSOLUTE): 0.2 10*3/uL (ref 0.0–0.4)
Eos: 2 %
Hematocrit: 40.2 % (ref 34.0–46.6)
Hemoglobin: 13.1 g/dL (ref 11.1–15.9)
Immature Grans (Abs): 0 10*3/uL (ref 0.0–0.1)
Immature Granulocytes: 0 %
Lymphocytes Absolute: 2.8 10*3/uL (ref 0.7–3.1)
Lymphs: 36 %
MCH: 27.6 pg (ref 26.6–33.0)
MCHC: 32.6 g/dL (ref 31.5–35.7)
MCV: 85 fL (ref 79–97)
Monocytes Absolute: 0.4 10*3/uL (ref 0.1–0.9)
Monocytes: 6 %
Neutrophils Absolute: 4.4 10*3/uL (ref 1.4–7.0)
Neutrophils: 55 %
Platelets: 340 10*3/uL (ref 150–450)
RBC: 4.75 x10E6/uL (ref 3.77–5.28)
RDW: 13 % (ref 11.7–15.4)
WBC: 8 10*3/uL (ref 3.4–10.8)

## 2020-03-03 ENCOUNTER — Telehealth: Payer: Self-pay | Admitting: Family Medicine

## 2020-03-03 NOTE — Telephone Encounter (Signed)
Reviewed form and placed in PCP's box for completion.  .Kadarious Dikes R Jasman Pfeifle, CMA  

## 2020-03-03 NOTE — Telephone Encounter (Signed)
FMLA form dropped off for at front desk for completion.  Verified that patient section of form has been completed.  Last DOS/WCC with PCP was 02/25/20.  Placed form in team folder to be completed by clinical staff.  [place]

## 2020-03-09 ENCOUNTER — Encounter: Payer: Self-pay | Admitting: Family Medicine

## 2020-03-09 ENCOUNTER — Ambulatory Visit
Admission: RE | Admit: 2020-03-09 | Discharge: 2020-03-09 | Disposition: A | Discharge status: Home / Self Care | Payer: No Typology Code available for payment source | Source: Ambulatory Visit | Attending: Family Medicine | Admitting: Family Medicine

## 2020-03-09 DIAGNOSIS — M545 Low back pain, unspecified: Secondary | ICD-10-CM

## 2020-03-09 NOTE — Telephone Encounter (Signed)
Form completed, will return to [facility] RN team Khamila Bassinger J Lawan Nanez, MD  

## 2020-03-10 NOTE — Telephone Encounter (Signed)
Form faxed and copy made for batch scanning.  

## 2020-03-20 ENCOUNTER — Other Ambulatory Visit: Payer: Self-pay | Admitting: Family Medicine

## 2020-03-26 ENCOUNTER — Ambulatory Visit: Payer: No Typology Code available for payment source | Admitting: Family Medicine

## 2020-05-09 ENCOUNTER — Other Ambulatory Visit: Payer: Self-pay | Admitting: Family Medicine

## 2020-05-09 DIAGNOSIS — E119 Type 2 diabetes mellitus without complications: Secondary | ICD-10-CM

## 2020-06-22 ENCOUNTER — Other Ambulatory Visit: Payer: Self-pay | Admitting: Family Medicine

## 2020-06-24 ENCOUNTER — Other Ambulatory Visit: Payer: Self-pay

## 2020-07-09 ENCOUNTER — Ambulatory Visit: Payer: No Typology Code available for payment source | Admitting: Family Medicine

## 2020-07-21 ENCOUNTER — Encounter: Payer: Self-pay | Admitting: Family Medicine

## 2020-07-21 ENCOUNTER — Other Ambulatory Visit: Payer: Self-pay

## 2020-07-21 ENCOUNTER — Ambulatory Visit (INDEPENDENT_AMBULATORY_CARE_PROVIDER_SITE_OTHER): Payer: No Typology Code available for payment source | Admitting: Family Medicine

## 2020-07-21 VITALS — BP 134/88 | HR 93 | Wt 254.0 lb

## 2020-07-21 DIAGNOSIS — E782 Mixed hyperlipidemia: Secondary | ICD-10-CM

## 2020-07-21 DIAGNOSIS — I10 Essential (primary) hypertension: Secondary | ICD-10-CM | POA: Diagnosis not present

## 2020-07-21 DIAGNOSIS — J4520 Mild intermittent asthma, uncomplicated: Secondary | ICD-10-CM | POA: Diagnosis not present

## 2020-07-21 DIAGNOSIS — E119 Type 2 diabetes mellitus without complications: Secondary | ICD-10-CM

## 2020-07-21 LAB — POCT GLYCOSYLATED HEMOGLOBIN (HGB A1C): Hemoglobin A1C: 7.5 % — AB (ref 4.0–5.6)

## 2020-07-21 MED ORDER — ALBUTEROL SULFATE HFA 108 (90 BASE) MCG/ACT IN AERS: INHALATION_SPRAY | RESPIRATORY_TRACT | 3 refills | Status: DC

## 2020-07-21 NOTE — Assessment & Plan Note (Signed)
Adequate control with A1c of 7.5, though would ideally like better than this. Patient to work on lifestyle changes. Follow up in 3 months for next A1c
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
## 2020-07-21 NOTE — Patient Instructions (Signed)
It was great to see you again today!  Get back on nasal steroid spray every day Refilled albuterol  Check sugars in morning Follow up in 3 months for next A1c  Reminder to get your eye exam in April  Labs today  Be well, Dr. Ardelia Mems

## 2020-07-21 NOTE — Assessment & Plan Note (Signed)
Well controlled. Continue current medication regimen.  

## 2020-07-21 NOTE — Assessment & Plan Note (Signed)
Continue statin. Check CMET & lipids today.

## 2020-07-22 LAB — CMP14+EGFR
ALT: 14 IU/L (ref 0–32)
AST: 19 IU/L (ref 0–40)
Albumin/Globulin Ratio: 1.4 (ref 1.2–2.2)
Albumin: 4.6 g/dL (ref 3.8–4.9)
Alkaline Phosphatase: 118 IU/L (ref 44–121)
BUN/Creatinine Ratio: 12 (ref 9–23)
BUN: 8 mg/dL (ref 6–24)
Bilirubin Total: 1.6 mg/dL — ABNORMAL HIGH (ref 0.0–1.2)
CO2: 25 mmol/L (ref 20–29)
Calcium: 10 mg/dL (ref 8.7–10.2)
Chloride: 99 mmol/L (ref 96–106)
Creatinine, Ser: 0.69 mg/dL (ref 0.57–1.00)
GFR calc Af Amer: 111 mL/min/{1.73_m2} (ref >59)
GFR calc non Af Amer: 96 mL/min/{1.73_m2} (ref >59)
Globulin, Total: 3.2 g/dL (ref 1.5–4.5)
Glucose: 125 mg/dL — ABNORMAL HIGH (ref 65–99)
Potassium: 3.9 mmol/L (ref 3.5–5.2)
Sodium: 141 mmol/L (ref 134–144)
Total Protein: 7.8 g/dL (ref 6.0–8.5)

## 2020-07-22 LAB — LIPID PANEL
Chol/HDL Ratio: 2.8 ratio (ref 0.0–4.4)
Cholesterol, Total: 177 mg/dL (ref 100–199)
HDL: 63 mg/dL (ref >39)
LDL Chol Calc (NIH): 88 mg/dL (ref 0–99)
Triglycerides: 151 mg/dL — ABNORMAL HIGH (ref 0–149)
VLDL Cholesterol Cal: 26 mg/dL (ref 5–40)

## 2020-07-23 ENCOUNTER — Encounter: Payer: Self-pay | Admitting: Family Medicine

## 2020-08-12 ENCOUNTER — Telehealth: Payer: Self-pay

## 2020-08-12 NOTE — Telephone Encounter (Signed)
Patient Annette Cabrera on nurse line requesting the status of her FMLA forms and where/when they were faxed. I see where she brought in FMLA forms at apt with PCP on 02/1. However, that is the last note I see. I called patient back to get more information, I had to Annette Cabrera. Will forward to PCP for any update.

## 2020-08-14 NOTE — Telephone Encounter (Signed)
FMLA paperwork completed. I called patient and let her know it was done and we will get it faxed in. Apologized for the delay - it was sitting on my desk and I didn't see it.  Patient appreciative of the call. Will return to [facility] RN team.  Leeanne Rio, MD

## 2020-08-17 NOTE — Telephone Encounter (Signed)
Faxed form to number provided at [phone]. Copy made and placed in batch scanning.   Talbot Grumbling, RN

## 2020-10-16 ENCOUNTER — Ambulatory Visit (INDEPENDENT_AMBULATORY_CARE_PROVIDER_SITE_OTHER): Payer: No Typology Code available for payment source | Admitting: Family Medicine

## 2020-10-16 ENCOUNTER — Other Ambulatory Visit: Payer: Self-pay

## 2020-10-16 ENCOUNTER — Encounter: Payer: Self-pay | Admitting: Family Medicine

## 2020-10-16 VITALS — BP 122/82 | HR 114 | Wt 250.0 lb

## 2020-10-16 DIAGNOSIS — R32 Unspecified urinary incontinence: Secondary | ICD-10-CM

## 2020-10-16 DIAGNOSIS — E119 Type 2 diabetes mellitus without complications: Secondary | ICD-10-CM | POA: Diagnosis not present

## 2020-10-16 DIAGNOSIS — R350 Frequency of micturition: Secondary | ICD-10-CM

## 2020-10-16 LAB — POCT URINALYSIS DIP (MANUAL ENTRY)
Glucose, UA: NEGATIVE mg/dL
Nitrite, UA: NEGATIVE
Protein Ur, POC: NEGATIVE mg/dL
Spec Grav, UA: 1.025 (ref 1.010–1.025)
Urobilinogen, UA: 0.2 E.U./dL
pH, UA: 5.5 (ref 5.0–8.0)

## 2020-10-16 LAB — POCT GLYCOSYLATED HEMOGLOBIN (HGB A1C): HbA1c, POC (controlled diabetic range): 11.7 % — AB (ref 0.0–7.0)

## 2020-10-16 MED ORDER — NITROFURANTOIN MONOHYD MACRO 100 MG PO CAPS: 100.0000 mg | ORAL_CAPSULE | Freq: Two times a day (BID) | ORAL | 0 refills | Status: DC

## 2020-10-16 NOTE — Progress Notes (Signed)
    SUBJECTIVE:   CHIEF COMPLAINT / HPI:    Annette Cabrera is a 60 y.o. female who complains of urinary frequency, urgency and dysuria for the past 4 weeks. Endorses a strong ammonia like odor. State she is constantly thirsty and endorses dry lips and mouth. Denies flank pain, fever, chills, or abnormal vaginal discharge or bleeding. States she no longer lives in the area so it is not as easy for her to get to appointments.   OBJECTIVE:   BP 122/82   Pulse (!) 114   Wt 250 lb (113.4 kg)   SpO2 96%   BMI 45.73 kg/m    General: alert, pleasant, NAD CV: RRR no murmurs Resp: CTAB. Normal WOB GI: soft, non distended. Tender to deep palpation in LUQ MSK: no CVA tenderness  ASSESSMENT/PLAN:   No problem-specific Assessment & Plan notes found for this encounter.   Urinary tract infection Patient presents with urinary frequency, urgency, dysuria for 4 weeks. UA with moderate leukocytes, trace ketones, trace RBCs and small bili   - Macrobid 100mg  BID   Type 2 Diabetes  A1c 7.5 on 2/1. Current A1c increased to 11.7. Tried calling patient several times to discuss results but no answer. Left message with results and ED return precautions. Urinary symptoms potentially also due to her uncontrolled diabetes. Once able to reach patient need to confirm if she is adherent to medication and what her current regimen is. Advised to call clinic to see PCP as soon as possible to follow up on her diabetes.  - pt to schedule appt with ophthalmologist   - f/u as soon as able to discuss diabetes regimen   [place]

## 2020-10-16 NOTE — Patient Instructions (Addendum)
It was great seeing you today!  You came in for UTI symptoms. We are treating you with Macrobid 100mg  twice a day for 5 days.  We also checked your hgb A1c   If symptoms persist after you complete treatment please give [place] a call.    Feel free to call with any questions or concerns at [phone].   Take care,  Dr. Shary Key [facility]    Urinary Tract Infection, Adult A urinary tract infection (UTI) is an infection of any part of the urinary tract. The urinary tract includes:  The kidneys.  The ureters.  The bladder.  The urethra. These organs make, store, and get rid of pee (urine) in the body. What are the causes? This infection is caused by germs (bacteria) in your genital area. These germs grow and cause swelling (inflammation) of your urinary tract. What increases the risk? The following factors may make you more likely to develop this condition:  Using a small, thin tube (catheter) to drain pee.  Not being able to control when you pee or poop (incontinence).  Being female. If you are female, these things can increase the risk: ? Using these methods to prevent pregnancy:  A medicine that kills sperm (spermicide).  A device that blocks sperm (diaphragm). ? Having low levels of a female hormone (estrogen). ? Being pregnant. You are more likely to develop this condition if:  You have genes that add to your risk.  You are sexually active.  You take antibiotic medicines.  You have trouble peeing because of: ? A prostate that is bigger than normal, if you are female. ? A blockage in the part of your body that drains pee from the bladder. ? A kidney stone. ? A nerve condition that affects your bladder. ? Not getting enough to drink. ? Not peeing often enough.  You have other conditions, such as: ? Diabetes. ? A weak disease-fighting system (immune system). ? Sickle cell disease. ? Gout. ? Injury of the spine. What are the  signs or symptoms? Symptoms of this condition include:  Needing to pee right away.  Peeing small amounts often.  Pain or burning when peeing.  Blood in the pee.  Pee that smells bad or not like normal.  Trouble peeing.  Pee that is cloudy.  Fluid coming from the vagina, if you are female.  Pain in the belly or lower back. Other symptoms include:  Vomiting.  Not feeling hungry.  Feeling mixed up (confused). This may be the first symptom in older adults.  Being tired and grouchy (irritable).  A fever.  Watery poop (diarrhea). How is this treated?  Taking antibiotic medicine.  Taking other medicines.  Drinking enough water. In some cases, you may need to see a specialist. Follow these instructions at home: Medicines  Take over-the-counter and prescription medicines only as told by your doctor.  If you were prescribed an antibiotic medicine, take it as told by your doctor. Do not stop taking it even if you start to feel better. General instructions  Make sure you: ? Pee until your bladder is empty. ? Do not hold pee for a long time. ? Empty your bladder after sex. ? Wipe from front to back after peeing or pooping if you are a female. Use each tissue one time when you wipe.  Drink enough fluid to keep your pee pale yellow.  Keep all follow-up visits.   Contact a doctor if:  You do not  get better after 1-2 days.  Your symptoms go away and then come back. Get help right away if:  You have very bad back pain.  You have very bad pain in your lower belly.  You have a fever.  You have chills.  You feeling like you will vomit or you vomit. Summary  A urinary tract infection (UTI) is an infection of any part of the urinary tract.  This condition is caused by germs in your genital area.  There are many risk factors for a UTI.  Treatment includes antibiotic medicines.  Drink enough fluid to keep your pee pale yellow. This information is not  intended to replace advice given to you by your health care provider. Make sure you discuss any questions you have with your health care provider. Document Revised: 01/17/2020 Document Reviewed: 01/17/2020 Elsevier Patient Education  [place].

## 2020-10-21 ENCOUNTER — Telehealth: Payer: Self-pay | Admitting: Family Medicine

## 2020-10-21 NOTE — Telephone Encounter (Signed)
Late entry- Called patient on 5/1 and 5/3 to discuss lab results. Left 2 voicemails advising patient to call the clinic to set up an appointment to recheck urine and labs.

## 2020-11-20 ENCOUNTER — Other Ambulatory Visit: Payer: Self-pay | Admitting: Family Medicine

## 2020-11-20 DIAGNOSIS — E119 Type 2 diabetes mellitus without complications: Secondary | ICD-10-CM

## 2020-11-26 NOTE — Telephone Encounter (Signed)
Patient calls nurse line to check status of insulin refill requested on 6/3.  Will forward to PCP  Talbot Grumbling, RN

## 2020-12-02 ENCOUNTER — Other Ambulatory Visit: Payer: Self-pay | Admitting: Family Medicine

## 2020-12-26 DIAGNOSIS — Z0100 Encounter for examination of eyes and vision without abnormal findings: Secondary | ICD-10-CM | POA: Diagnosis not present

## 2020-12-26 DIAGNOSIS — E119 Type 2 diabetes mellitus without complications: Secondary | ICD-10-CM | POA: Diagnosis not present

## 2021-01-29 ENCOUNTER — Telehealth: Payer: Self-pay | Admitting: Family Medicine

## 2021-01-29 NOTE — Telephone Encounter (Signed)
FMLA form dropped off for at front desk for completion.  Verified that patient section of form has been completed.  Last DOS/WCC with PCP was 10/16/20.  Placed form in team folder to be completed by clinical staff.  Creig Hines

## 2021-01-29 NOTE — Telephone Encounter (Signed)
Reviewed form and placed in PCP's box to complete.  Annette Cabrera, [place]

## 2021-01-29 NOTE — Telephone Encounter (Signed)
Forms completed, will return to [facility] RN team. Leeanne Rio, MD

## 2021-02-02 NOTE — Telephone Encounter (Signed)
Forms faxed to appropriate number and a copy as scanned into chart.

## 2021-02-22 ENCOUNTER — Other Ambulatory Visit: Payer: Self-pay

## 2021-02-22 ENCOUNTER — Ambulatory Visit (HOSPITAL_COMMUNITY)
Admission: EM | Admit: 2021-02-22 | Discharge: 2021-02-22 | Disposition: A | Discharge status: Home / Self Care | Payer: No Typology Code available for payment source | Attending: Family Medicine | Admitting: Family Medicine

## 2021-02-22 ENCOUNTER — Encounter (HOSPITAL_COMMUNITY): Payer: Self-pay

## 2021-02-22 DIAGNOSIS — M25562 Pain in left knee: Secondary | ICD-10-CM

## 2021-02-22 DIAGNOSIS — M25521 Pain in right elbow: Secondary | ICD-10-CM | POA: Diagnosis not present

## 2021-02-22 DIAGNOSIS — W19XXXA Unspecified fall, initial encounter: Secondary | ICD-10-CM

## 2021-02-22 MED ORDER — CYCLOBENZAPRINE HCL 5 MG PO TABS: 5.0000 mg | ORAL_TABLET | Freq: Three times a day (TID) | ORAL | 0 refills | Status: DC | PRN

## 2021-02-22 NOTE — ED Triage Notes (Signed)
Pt reports left knee pain and right arm pain x 5 days after she fell on the ground. Ice pack and Tylenol gives no relief.

## 2021-02-22 NOTE — ED Provider Notes (Signed)
[place]    CSN: [id number] Arrival date & time: 02/22/21  1191      History   Chief Complaint Chief Complaint  Patient presents with   Fall   Knee Pain   Arm Pain    HPI Annette Cabrera is a 60 y.o. female.   Patient presenting today with right elbow and shoulder pain, left anterior knee pain following a mechanical fall 5 days ago.  She states she fell directly onto these areas and has some bruising, swelling and pain with active range of motion of these areas.  She denies decreased range of motion, crepitus, or deformity, numbness, tingling, weakness.  Did not hit head during fall and does not have any subsequent neurologic symptoms.  Has been taking ibuprofen and icing the areas with temporary relief.   Past Medical History:  Diagnosis Date   Asthma    uses inhaler   Diabetes mellitus without complication ([place])    Gilbert's syndrome 02/28/2020   Hyperlipidemia    Hypertension    Meniere's disease    on and off   Post-operative nausea and vomiting    Sleep apnea    uses c-pap    Patient Active Problem List   Diagnosis Date Noted   Gilbert's syndrome 02/28/2020   Left knee pain 08/16/2019   Acute non-recurrent maxillary sinusitis 02/07/2019   Retrocalcaneal bursitis (back of heel), right 12/06/2018   Achilles tendonosis of right lower extremity 12/06/2018   Perineal irritation 01/31/2018   OSA (obstructive sleep apnea) 05/10/2017   Shoulder pain 04/21/2017   Vitamin D deficiency 04/21/2017   Morbid obesity ([place]) 04/03/2017   DOE (dyspnea on exertion) 04/03/2017   Hot flashes 03/24/2017   Tachycardia 03/24/2017   Plantar fasciitis 03/24/2017   Sleep difficulties 05/10/2016   GERD (gastroesophageal reflux disease) 05/10/2016   Asthma 05/10/2016   Hyperlipidemia 05/09/2016   Diabetes type 2, controlled ([place]) 09/13/2015   Meniere's disease 09/16/2013   Allergic rhinitis 05/30/2013   Hypertension 04/24/2013    Past Surgical History:   Procedure Laterality Date   NOVASURE ABLATION  2009   TUBAL LIGATION  2000    OB History   No obstetric history on file.      Home Medications    Prior to Admission medications   Medication Sig Start Date End Date Taking? Authorizing Provider  cyclobenzaprine (FLEXERIL) 5 MG tablet Take 1 tablet (5 mg total) by mouth 3 (three) times daily as needed for muscle spasms. Do not drink alcohol or drive while taking this medication.  May cause drowsiness 02/22/21  Yes Volney American, PA-C  albuterol (VENTOLIN HFA) 108 (90 Base) MCG/ACT inhaler TAKE 2 PUFFS BY MOUTH EVERY 6 HOURS AS NEEDED FOR WHEEZE OR SHORTNESS OF BREATH 07/21/20   Leeanne Rio, MD  amLODipine (NORVASC) 10 MG tablet TAKE 1 TABLET BY MOUTH EVERY DAY 03/20/20   Leeanne Rio, MD  aspirin EC 81 MG tablet Take 2 tablets (162 mg total) by mouth daily. 04/03/17   Revankar, Reita Cliche, MD  atorvastatin (LIPITOR) 40 MG tablet TAKE 1 TABLET BY MOUTH EVERY DAY 12/02/20   Lind Covert, MD  BD PEN NEEDLE NANO 2ND GEN 32G X 4 MM MISC USE TO INJECT INSULIN 11/07/19   Leeanne Rio, MD  Blood Glucose Monitoring Suppl (ONE TOUCH ULTRA 2) w/Device KIT Check blood sugar as needed for symptoms of hypoglycemia. 02/16/16   Leeanne Rio, MD  fexofenadine (ALLEGRA) 180 MG tablet Take 180 mg  by mouth daily.    [provider]  hydrochlorothiazide (HYDRODIURIL) 25 MG tablet TAKE 1 TABLET BY MOUTH EVERY DAY 06/22/20   Leeanne Rio, MD  Insulin Glargine [facility]) 100 UNIT/ML Inject 30 Units into the skin daily. 11/27/20   Leeanne Rio, MD  KLOR-CON M20 20 MEQ tablet TAKE 2 TABLETS BY MOUTH EVERY DAY 06/22/20   Leeanne Rio, MD  Lancets [facility] ULTRASOFT) lancets CHECK BLOOD SUGAR AS NEEDED FOR SYMPTOMS OF HYPOGLYCEMIA 05/11/20   Leeanne Rio, MD  lisinopril (ZESTRIL) 20 MG tablet TAKE 1 TABLET BY MOUTH EVERY DAY 06/22/20   Leeanne Rio, MD  metFORMIN  (GLUCOPHAGE-XR) 500 MG 24 hr tablet TAKE 2 TABLETS BY MOUTH TWICE A DAY 06/22/20   Leeanne Rio, MD  mometasone (NASONEX) 50 MCG/ACT nasal spray Place 2 sprays into the nose daily. 05/09/16   Leeanne Rio, MD  nitrofurantoin, macrocrystal-monohydrate, (MACROBID) 100 MG capsule Take 1 capsule (100 mg total) by mouth 2 (two) times daily. 10/16/20   Shary Key, DO  ONETOUCH ULTRA test strip CHECK BLOOD SUGAR AS NEEDED FOR SYMPTOMS OF HYPOGLYCEMIA 05/11/20   Leeanne Rio, MD  potassium chloride 20 MEQ TBCR Take 40 mEq by mouth daily. 05/14/13 07/24/13  Leeanne Rio, MD    Family History Family History  Problem Relation Age of Onset   Diabetes Mother    Diabetes Father    Lung cancer Father 12       smoked for many years, died at 82 of lung cancer   Heart disease Father    Fibroids Sister    Diabetes Brother    Cerebral aneurysm Brother     Social History Social History   Tobacco Use   Smoking status: Never   Smokeless tobacco: Never  Vaping Use   Vaping Use: Never used  Substance Use Topics   Alcohol use: No   Drug use: No     Allergies   Penicillins   Review of Systems Review of Systems Per HPI  Physical Exam Triage Vital Signs ED Triage Vitals [02/22/21 0859]  Enc Vitals Group     BP 130/85     Pulse Rate 90     Resp 18     Temp 99 F (37.2 C)     Temp Source Oral     SpO2 97 %     Weight      Height      Head Circumference      Peak Flow      Pain Score      Pain Loc      Pain Edu?      Excl. in [place]?    No data found.  Updated Vital Signs BP 130/85 (BP Location: Left Arm)   Pulse 90   Temp 99 F (37.2 C) (Oral)   Resp 18   SpO2 97%   Visual Acuity Right Eye Distance:   Left Eye Distance:   Bilateral Distance:    Right Eye Near:   Left Eye Near:    Bilateral Near:     Physical Exam Vitals and nursing note reviewed.  Constitutional:      Appearance: Normal appearance. She is not ill-appearing.  HENT:      Head: Atraumatic.     Mouth/Throat:     Mouth: Mucous membranes are moist.  Eyes:     Extraocular Movements: Extraocular movements intact.     Conjunctiva/sclera: Conjunctivae normal.  Cardiovascular:  Rate and Rhythm: Normal rate and regular rhythm.     Heart sounds: Normal heart sounds.  Pulmonary:     Effort: Pulmonary effort is normal. No respiratory distress.     Breath sounds: Normal breath sounds. No wheezing.  Musculoskeletal:        General: Swelling, tenderness and signs of injury present. No deformity. Normal range of motion.     Cervical back: Normal range of motion and neck supple.     Comments: Contusion present to left elbow with mild localized edema, mild edema to anterior and lateral left knee.  Good passive range of motion in all 4 extremities, grip strength full and equal bilateral hands, normal gait, no joint laxity in all 4 extremities  Skin:    General: Skin is warm and dry.     Findings: Bruising present. No erythema or lesion.  Neurological:     Mental Status: She is alert and oriented to person, place, and time.     Motor: No weakness.     Gait: Gait normal.     Comments: All 4 extremities neurovascular intact  Psychiatric:        Mood and Affect: Mood normal.        Thought Content: Thought content normal.        Judgment: Judgment normal.     UC Treatments / Results  Labs (all labs ordered are listed, but only abnormal results are displayed) Labs Reviewed - No data to display  EKG   Radiology No results found.  Procedures Procedures (including critical care time)  Medications Ordered in UC Medications - No data to display  Initial Impression / Assessment and Plan / UC Course  I have reviewed the triage vital signs and the nursing notes.  Pertinent labs & imaging results that were available during my care of the patient were reviewed by me and considered in my medical decision making (see chart for details).     Exam very  reassuring, no evidence of bony abnormalities or concern for fractures based on exam today.  X-ray imaging deferred with shared decision making.  Suspect contusions to be causing her pain and soreness.  We will treat with ibuprofen, ice, rest, Flexeril as needed.  Follow-up with PCP for recheck if not fully resolving.  Final Clinical Impressions(s) / UC Diagnoses   Final diagnoses:  Right elbow pain  Acute pain of left knee  Fall, initial encounter   Discharge Instructions   None    ED Prescriptions     Medication Sig Dispense Auth. Provider   cyclobenzaprine (FLEXERIL) 5 MG tablet Take 1 tablet (5 mg total) by mouth 3 (three) times daily as needed for muscle spasms. Do not drink alcohol or drive while taking this medication.  May cause drowsiness 15 tablet Volney American, [place]      PDMP not reviewed this encounter.   Merrie Roof [place], [place] 02/22/21 [phone]

## 2021-03-17 ENCOUNTER — Other Ambulatory Visit: Payer: Self-pay | Admitting: Family Medicine

## 2021-03-24 ENCOUNTER — Encounter: Payer: Self-pay | Admitting: Gastroenterology

## 2021-04-13 DIAGNOSIS — J0100 Acute maxillary sinusitis, unspecified: Secondary | ICD-10-CM | POA: Diagnosis not present

## 2021-04-13 DIAGNOSIS — H10022 Other mucopurulent conjunctivitis, left eye: Secondary | ICD-10-CM | POA: Diagnosis not present

## 2021-04-29 ENCOUNTER — Other Ambulatory Visit: Payer: Self-pay | Admitting: Family Medicine

## 2021-04-30 ENCOUNTER — Encounter: Payer: Self-pay | Admitting: Family Medicine

## 2021-05-16 ENCOUNTER — Other Ambulatory Visit: Payer: Self-pay | Admitting: Family Medicine

## 2021-05-16 DIAGNOSIS — E119 Type 2 diabetes mellitus without complications: Secondary | ICD-10-CM

## 2021-05-29 ENCOUNTER — Other Ambulatory Visit: Payer: Self-pay | Admitting: Family Medicine

## 2021-06-28 ENCOUNTER — Other Ambulatory Visit: Payer: Self-pay

## 2021-06-28 ENCOUNTER — Telehealth: Payer: Self-pay

## 2021-06-28 NOTE — Telephone Encounter (Signed)
Patient calls nurse line reporting a small "cut" or "bite" on her abdomen. Patient reports she noticed it last week, however the area is not improving. Patient does not remember cutting herself or getting bit. Patient reports the area is tender and warm to touch. Patient reports if she "squeezes" it some white pus does come out. Patient denies any malodorous odors, fevers/chills, body aches, nausea/vomiting or SOB.  Patient reports she lives in [place] now and needs an early morning apt. Patient scheduled for Thursday for evaluation. Strict ED precautions given to patient in the meantime. Patient advised to keep the area dry and clean.

## 2021-06-29 MED ORDER — ATORVASTATIN CALCIUM 40 MG PO TABS: 40.0000 mg | ORAL_TABLET | Freq: Every day | ORAL | 0 refills | Status: DC

## 2021-06-30 NOTE — Progress Notes (Signed)
° ° °  SUBJECTIVE:   CHIEF COMPLAINT / HPI:   Sore on stomach -noticed it 1 week ago -does not recall cutting herself or getting bit -initially was painful -felt warm to touch -has drained some white pus material -she has been applying OTC triple antibiotic ointment -reports it is now improving -no fever/chills  Possible Yeast Infection -Patient thinks she may have a yeast infection -notes vaginal itching/irritation -small amount of white discharge -symptoms started in October after taking antibiotics for pink eye -has taken 3 rounds of OTC yeast medications (Monostat and vagisil) but symptoms recur -no dysuria or urinary symptoms  Back pain -Started gradually last week, getting progressively worse -4 days ago she had difficulty walking due to the pain -Located in right lower back, radiates into upper posterior thigh  -Moving makes it worse -Rest has been helpful -Using heated blanket which is also helpful -No specific injury or trauma -No red flag symptoms   PERTINENT  PMH / PSH: HTN, T2DM, HLD, OSA  OBJECTIVE:   BP (!) 129/91    Pulse (!) 101    Wt 255 lb 3.2 oz (115.8 kg)    SpO2 100%    BMI 46.68 kg/m   Gen: alert, non-toxic appearing, NAD Skin: area of erythema on abdomen as shown below with small amount of bloody drainage from central ulceration. Surrounding erythema and increased warmth. No induration  Back: no midline bony tenderness. Mild tenderness to palpation over right SI joint. Full ROM with flexion, extension, rotation, and lateral side bending although lateral bending toward R causes pain. Negative straight leg raise. Pain reproducible with resisted hip flexion. GU/GYN: Exam performed in the presence of a chaperone. Mild atrophy and dryness of external vaginal skin and mucosa. Landmarks intact. Nonfriable cervix without lesions, no discharge or bleeding noted on speculum exam.     ASSESSMENT/PLAN:   Skin infection Patient noticed lesion on abdomen x1  week. Improving per her report. However, exam concerning for early cellulitis given surrounding erythema and warmth. No fever/chills or systemic symptoms which is reassuring. -Doxycycline 100mg  BID x5 days -Return precautions reviewed  Vaginal itching Present for several months. Has not responded to OTC yeast medications. Wet prep negative for yeast and BV today. Mild vaginal atrophy noted on pelvic exam. Suspect her symptoms are related to post-menopausal vaginal atrophy. -Encouraged daily or twice daily lubrication with KY jelly -Consider vaginal estrogen cream if no improvement  Acute right-sided low back pain without sciatica Right lower back pain x1 week in the absence of known injury. No red flags. Exam consistent with musculoskeletal strain vs SI joint pain. Advised conservative treatment with NSAIDs, heat, rest. Return if no improvement.   Poorly Controlled Type 2 Diabetes Patient has not been seen for her diabetes since April 2022, at which time her A1c was 11.7%. She lives in [place] now, but has decided she'd like to continue care here rather than establish with new provider in [place] to schedule diabetes visit ASAP!  Annette Dad, MD [place]

## 2021-07-01 ENCOUNTER — Ambulatory Visit (INDEPENDENT_AMBULATORY_CARE_PROVIDER_SITE_OTHER): Payer: No Typology Code available for payment source | Admitting: Family Medicine

## 2021-07-01 ENCOUNTER — Telehealth: Payer: Self-pay | Admitting: Family Medicine

## 2021-07-01 ENCOUNTER — Other Ambulatory Visit: Payer: Self-pay

## 2021-07-01 VITALS — BP 129/91 | HR 101 | Wt 255.2 lb

## 2021-07-01 DIAGNOSIS — M545 Low back pain, unspecified: Secondary | ICD-10-CM

## 2021-07-01 DIAGNOSIS — L089 Local infection of the skin and subcutaneous tissue, unspecified: Secondary | ICD-10-CM

## 2021-07-01 DIAGNOSIS — N898 Other specified noninflammatory disorders of vagina: Secondary | ICD-10-CM

## 2021-07-01 LAB — POCT WET PREP (WET MOUNT)
Clue Cells Wet Prep Whiff POC: NEGATIVE
Trichomonas Wet Prep HPF POC: ABSENT

## 2021-07-01 MED ORDER — DOXYCYCLINE HYCLATE 100 MG PO TABS: 100.0000 mg | ORAL_TABLET | Freq: Two times a day (BID) | ORAL | 0 refills | Status: AC

## 2021-07-01 NOTE — Telephone Encounter (Signed)
Reviewed form and placed in provider's box for completion.  Ozella Almond, [place]

## 2021-07-01 NOTE — Patient Instructions (Addendum)
It was great to meet you!  - I have prescribed an antibiotic for the sore on your stomach. Take it twice daily for the next 5 days. Take with food/water.  - You do not have any signs of a yeast infection. I think your symptoms may be related to thinning and dryness of the vagina, which often occurs with aging and after menopause. Try twice daily KY jelly for lubrication. If there is no improvement we can consider estrogen cream.  - For your back, you can take anti-inflammatory medication for the next 3-5 days (Ibuprofen or Aleve). Continue heating and resting the area.   Please schedule an appointment ASAP to discuss your diabetes.  Take care and seek immediate care sooner if you develop any concerns.  Dr. [facility]

## 2021-07-01 NOTE — Telephone Encounter (Signed)
Patient dropped off FMLA form to be completed. Last DOS was 07/01/21. Placed in [employer].

## 2021-07-01 NOTE — Assessment & Plan Note (Signed)
Patient noticed lesion on abdomen x1 week. Improving per her report. However, exam concerning for early cellulitis given surrounding erythema and warmth. No fever/chills or systemic symptoms which is reassuring. -Doxycycline 100mg  BID x5 days -Return precautions reviewed

## 2021-07-01 NOTE — Assessment & Plan Note (Signed)
Present for several months. Has not responded to OTC yeast medications. Wet prep negative for yeast and BV today. Mild vaginal atrophy noted on pelvic exam. Suspect her symptoms are related to post-menopausal vaginal atrophy. -Encouraged daily or twice daily lubrication with KY jelly -Consider vaginal estrogen cream if no improvement

## 2021-07-01 NOTE — Assessment & Plan Note (Signed)
Right lower back pain x1 week in the absence of known injury. No red flags. Exam consistent with musculoskeletal strain vs SI joint pain. Advised conservative treatment with NSAIDs, heat, rest. Return if no improvement.

## 2021-07-06 ENCOUNTER — Other Ambulatory Visit: Payer: Self-pay | Admitting: Family Medicine

## 2021-07-06 NOTE — Telephone Encounter (Signed)
Place completed form in RN front office box.   Please scan FMLA form into patient's chart. Please send requested patient records to [place] [facility]'t

## 2021-07-14 NOTE — Telephone Encounter (Signed)
Form faxed to appropriate number and a copy was made for batch scanning.

## 2021-07-21 ENCOUNTER — Encounter: Payer: Self-pay | Admitting: Family Medicine

## 2021-07-21 ENCOUNTER — Ambulatory Visit (INDEPENDENT_AMBULATORY_CARE_PROVIDER_SITE_OTHER): Payer: No Typology Code available for payment source | Admitting: Family Medicine

## 2021-07-21 ENCOUNTER — Other Ambulatory Visit: Payer: Self-pay

## 2021-07-21 VITALS — BP 140/82 | HR 99 | Wt 256.8 lb

## 2021-07-21 DIAGNOSIS — Z794 Long term (current) use of insulin: Secondary | ICD-10-CM

## 2021-07-21 DIAGNOSIS — E119 Type 2 diabetes mellitus without complications: Secondary | ICD-10-CM | POA: Diagnosis not present

## 2021-07-21 DIAGNOSIS — E1165 Type 2 diabetes mellitus with hyperglycemia: Secondary | ICD-10-CM

## 2021-07-21 LAB — POCT GLYCOSYLATED HEMOGLOBIN (HGB A1C): HbA1c, POC (controlled diabetic range): 10 % — AB (ref 0.0–7.0)

## 2021-07-21 MED ORDER — ONETOUCH ULTRA VI STRP: ORAL_STRIP | 11 refills | Status: DC

## 2021-07-21 MED ORDER — ONETOUCH ULTRASOFT LANCETS MISC: 11 refills | Status: DC

## 2021-07-21 NOTE — Patient Instructions (Addendum)
Thank you for coming today.   We discussed diabetes regimen and will need to take your nighttime metformin.  Please log your blood sugars on the log sheet.  Continue current medications for blood pressure.  Follow-up in 3 months or sooner if needed.

## 2021-07-21 NOTE — Progress Notes (Signed)
° ° °  SUBJECTIVE:   CHIEF COMPLAINT / HPI:   Diabetes Current Regimen: glargine 30 U daily, metformin 1000 mg twice daily (has not been taking nighttime dose of metformin) CBGs: Only checking occasionally unsure what her averages Last A1c: 7.5 on 07/21/2020 Denies polyuria, polydipsia, hypoglycemia Last Eye Exam: 09/30/2019 Statin: Lipitor 40 mg daily ACE/ARB: Lisinopril 20 mg daily  Hypertension: - Medications: Amlodipine 10 mg daily, hydrochlorothiazide 25 mg daily, lisinopril 20 mg daily - Compliance: Yes - Checking BP at home: Yes, usually less than 140 - Denies any SOB, CP, vision changes, LE edema, medication SEs, or symptoms of hypotension  PERTINENT  PMH / PSH: Asthma, OSA, HLD  OBJECTIVE:   BP 140/82    Pulse 99    Wt 256 lb 12.8 oz (116.5 kg)    SpO2 99%    BMI 46.97 kg/m   Results for orders placed or performed in visit on 07/21/21 (from the past 72 hour(s))  HgB A1c     Status: Abnormal   Collection Time: 07/21/21  8:32 AM  Result Value Ref Range   Hemoglobin A1C     HbA1c POC (<> result, manual entry)     HbA1c, POC (prediabetic range)     HbA1c, POC (controlled diabetic range) 10.0 (A) 0.0 - 7.0 %     Collection Time: 07/21/21  9:07 AM   Physical Exam Vitals reviewed.  Constitutional:      General: She is not in acute distress.    Appearance: She is not ill-appearing.  Cardiovascular:     Rate and Rhythm: Normal rate and regular rhythm.     Heart sounds: Normal heart sounds.  Pulmonary:     Effort: Pulmonary effort is normal.     Breath sounds: Normal breath sounds.  Neurological:     Mental Status: She is alert and oriented to person, place, and time.  Psychiatric:        Mood and Affect: Mood normal.        Behavior: Behavior normal.   ASSESSMENT/PLAN:   1. Uncontrolled type 2 diabetes mellitus with hyperglycemia, with long-term current use of insulin (HCC) A1c 10.0 today.  Patient endorses medication nonadherence to nighttime dose of metformin.   We discussed introducing this into her medication regimen again.  I also gave her a log to log her blood sugars and to bring her log she back at her 43-month follow-up.  Dispensed test strips and lancets so the patient will be able to check her blood sugars. - Basic Metabolic Panel - glucose blood (ONETOUCH ULTRA) test strip; CHECK BLOOD SUGAR AS NEEDED FOR SYMPTOMS OF HYPOGLYCEMIA  Dispense: 100 strip; Refill: 11 - Lancets (ONETOUCH ULTRASOFT) lancets; Use as instructed  Dispense: 100 each; Refill: [place]

## 2021-07-22 LAB — BASIC METABOLIC PANEL
BUN/Creatinine Ratio: 9 (ref 9–23)
BUN: 7 mg/dL (ref 6–24)
CO2: 26 mmol/L (ref 20–29)
Calcium: 9.8 mg/dL (ref 8.7–10.2)
Chloride: 100 mmol/L (ref 96–106)
Creatinine, Ser: 0.77 mg/dL (ref 0.57–1.00)
Glucose: 195 mg/dL — ABNORMAL HIGH (ref 70–99)
Potassium: 4 mmol/L (ref 3.5–5.2)
Sodium: 140 mmol/L (ref 134–144)
eGFR: 89 mL/min/{1.73_m2} (ref >59)

## 2021-07-23 ENCOUNTER — Other Ambulatory Visit: Payer: Self-pay | Admitting: Family Medicine

## 2021-07-27 ENCOUNTER — Other Ambulatory Visit: Payer: Self-pay | Admitting: Family Medicine

## 2021-08-02 ENCOUNTER — Other Ambulatory Visit: Payer: Self-pay | Admitting: Family Medicine

## 2021-08-22 ENCOUNTER — Other Ambulatory Visit: Payer: Self-pay | Admitting: Family Medicine

## 2021-09-26 ENCOUNTER — Other Ambulatory Visit: Payer: Self-pay | Admitting: Family Medicine

## 2021-10-11 ENCOUNTER — Other Ambulatory Visit: Payer: Self-pay | Admitting: Family Medicine

## 2021-10-12 NOTE — Telephone Encounter (Signed)
Please let patient know I am refilling this medication, but she needs to schedule an appointment with me.   Thanks, Ibraheem Voris J Lonn Im, MD  

## 2021-10-26 ENCOUNTER — Ambulatory Visit: Payer: No Typology Code available for payment source | Admitting: Family Medicine

## 2021-11-18 ENCOUNTER — Ambulatory Visit (INDEPENDENT_AMBULATORY_CARE_PROVIDER_SITE_OTHER): Payer: No Typology Code available for payment source

## 2021-11-18 ENCOUNTER — Ambulatory Visit (INDEPENDENT_AMBULATORY_CARE_PROVIDER_SITE_OTHER): Payer: No Typology Code available for payment source | Admitting: Family Medicine

## 2021-11-18 VITALS — BP 135/85 | HR 105 | Temp 98.5°F | Wt 257.2 lb

## 2021-11-18 DIAGNOSIS — Z794 Long term (current) use of insulin: Secondary | ICD-10-CM | POA: Diagnosis not present

## 2021-11-18 DIAGNOSIS — I10 Essential (primary) hypertension: Secondary | ICD-10-CM | POA: Diagnosis not present

## 2021-11-18 DIAGNOSIS — Z1231 Encounter for screening mammogram for malignant neoplasm of breast: Secondary | ICD-10-CM

## 2021-11-18 DIAGNOSIS — E1165 Type 2 diabetes mellitus with hyperglycemia: Secondary | ICD-10-CM | POA: Diagnosis not present

## 2021-11-18 DIAGNOSIS — E782 Mixed hyperlipidemia: Secondary | ICD-10-CM | POA: Diagnosis not present

## 2021-11-18 DIAGNOSIS — Z23 Encounter for immunization: Secondary | ICD-10-CM | POA: Diagnosis not present

## 2021-11-18 LAB — POCT GLYCOSYLATED HEMOGLOBIN (HGB A1C): HbA1c, POC (controlled diabetic range): 10 % — AB (ref 0.0–7.0)

## 2021-11-18 MED ORDER — ONETOUCH DELICA LANCETS 33G MISC: 3 refills | Status: DC

## 2021-11-18 MED ORDER — ONETOUCH VERIO W/DEVICE KIT: PACK | 0 refills | Status: DC

## 2021-11-18 MED ORDER — ONETOUCH VERIO VI STRP: ORAL_STRIP | 3 refills | Status: DC

## 2021-11-18 MED ORDER — SHINGRIX 50 MCG/0.5ML IM SUSR: INTRAMUSCULAR | 1 refills | Status: DC

## 2021-11-18 MED ORDER — ONETOUCH DELICA LANCING DEV MISC: 0 refills | Status: DC

## 2021-11-18 MED ORDER — SEMAGLUTIDE(0.25 OR 0.5MG/DOS) 2 MG/1.5ML ~~LOC~~ SOPN: 0.2500 mg | PEN_INJECTOR | SUBCUTANEOUS | 3 refills | Status: DC

## 2021-11-18 NOTE — Progress Notes (Signed)
    SUBJECTIVE:   CHIEF COMPLAINT / HPI:   Patient presents today for routine follow up.   Diabetes - currently taking basaglar 30u daily and metformin XR '1000mg'$  twice daily. She is doing very well overall with no complaints. No CP, SOB, dizziness, headache, vision changes. She is concerned about her weight and would be interested in medication to help manage weight, but reports prior intolerance of Jardiance with itching. Needs new meter.  Hypertension - taking lisinopril '20mg'$  daily, HCTZ '25mg'$  daily, amlodipine '10mg'$  daily. Tolerating these well.  Allergies - taking allegra regularly. Does report some allergy symptoms of watery, itchy eyes in the morning. She is taking all her medications as prescribed.   Hyperlipidemia - taking atorvastatin '40mg'$  daily  She last saw her ophthalmologist in 01/2021 and reports no issues with her eyes. She previously saw a cardiologist for palpitations and had a normal workup (including a reported normal stress test); her cardiologist prescribed her 162 mg aspirin daily.  Not really exercising regularly as she's working from home. She reports eating a lot of fruit. She usually cooks for herself: [place] food, including a lot of rice and potatoes. She has been ordering out more recently, around 1-2 nights a week. Not drinking sodas, eats occasional sweets. Social drinker, no tobacco use.  PERTINENT  PMH / PSH: T2DM, HTN, HLD  OBJECTIVE:   BP 135/85   Pulse (!) 105   Temp 98.5 F (36.9 C)   Wt 257 lb 3.2 oz (116.7 kg)   SpO2 95%   BMI 47.04 kg/m   Gen: NAD, pleasant, cooperative HEENT: NCAT Heart: RRR, no murmurs Lungs: CTAB, NWOB Neuro: grossly nonfocal, speech normal, follows commands, alert and oriented   ASSESSMENT/PLAN:   Diabetes A1c uncontrolled at 10 Sent in new supplies to test regularly Start ozempic - gave instructions for uptitration Continue current insulin regimen, no symptoms of lows.   Hypertension Well controlled. Continue  current medication regimen.   Hyperlipidemia Update lipids today  Health maintenance -ordered mammo & given instructions on how to schedule -shingrix rx provided today -will request records from last eye exam - in Aug at [facility] booster given today  Patient seen along with medical student Carmelina Dane who helped prepare this note. I personally evaluated this patient along with the student, and verified all aspects of the history, physical exam, and medical decision making as documented by the student. I agree with the student's documentation and have made all necessary edits.  Chrisandra Netters, MD  [place]

## 2021-11-18 NOTE — Patient Instructions (Signed)
It was great to see you again today!  Sent in new meter and supplies  Start ozempic weekly - titrate up as we discussed  COVID vaccine booster tdoay  Take shingles shot prescription to your pharmacy  To schedule your mammogram, call the [facility] at [phone]. They are located at [address].      Checking cholesterol today  Follow up with me in 3 months, sooner if needed  Be well, Dr. Ardelia Mems

## 2021-11-19 LAB — LIPID PANEL
Chol/HDL Ratio: 2.1 ratio (ref 0.0–4.4)
Cholesterol, Total: 148 mg/dL (ref 100–199)
HDL: 69 mg/dL (ref >39)
LDL Chol Calc (NIH): 59 mg/dL (ref 0–99)
Triglycerides: 116 mg/dL (ref 0–149)
VLDL Cholesterol Cal: 20 mg/dL (ref 5–40)

## 2021-11-23 ENCOUNTER — Encounter: Payer: Self-pay | Admitting: *Deleted

## 2021-12-03 ENCOUNTER — Ambulatory Visit: Payer: No Typology Code available for payment source | Admitting: Pharmacist

## 2021-12-15 ENCOUNTER — Encounter (HOSPITAL_COMMUNITY): Payer: Self-pay | Admitting: *Deleted

## 2021-12-15 ENCOUNTER — Emergency Department (HOSPITAL_COMMUNITY)
Admission: EM | Admit: 2021-12-15 | Discharge: 2021-12-15 | Discharge status: Against Medical Advice | Payer: No Typology Code available for payment source | Attending: Emergency Medicine | Admitting: Emergency Medicine

## 2021-12-15 ENCOUNTER — Other Ambulatory Visit: Payer: Self-pay

## 2021-12-15 ENCOUNTER — Emergency Department (HOSPITAL_COMMUNITY): Payer: No Typology Code available for payment source

## 2021-12-15 DIAGNOSIS — Z5321 Procedure and treatment not carried out due to patient leaving prior to being seen by health care provider: Secondary | ICD-10-CM | POA: Diagnosis not present

## 2021-12-15 DIAGNOSIS — M542 Cervicalgia: Secondary | ICD-10-CM | POA: Insufficient documentation

## 2021-12-15 DIAGNOSIS — M79601 Pain in right arm: Secondary | ICD-10-CM | POA: Diagnosis not present

## 2021-12-15 DIAGNOSIS — M79603 Pain in arm, unspecified: Secondary | ICD-10-CM | POA: Diagnosis not present

## 2021-12-15 DIAGNOSIS — R110 Nausea: Secondary | ICD-10-CM | POA: Insufficient documentation

## 2021-12-15 DIAGNOSIS — R079 Chest pain, unspecified: Secondary | ICD-10-CM | POA: Diagnosis not present

## 2021-12-15 LAB — CBC
HCT: 41.9 % (ref 36.0–46.0)
Hemoglobin: 13.9 g/dL (ref 12.0–15.0)
MCH: 29.5 pg (ref 26.0–34.0)
MCHC: 33.2 g/dL (ref 30.0–36.0)
MCV: 89 fL (ref 80.0–100.0)
Platelets: 293 10*3/uL (ref 150–400)
RBC: 4.71 MIL/uL (ref 3.87–5.11)
RDW: 11.9 % (ref 11.5–15.5)
WBC: 8.8 10*3/uL (ref 4.0–10.5)
nRBC: 0 % (ref 0.0–0.2)

## 2021-12-15 LAB — BASIC METABOLIC PANEL
Anion gap: 13 (ref 5–15)
BUN: 8 mg/dL (ref 6–20)
CO2: 23 mmol/L (ref 22–32)
Calcium: 9.3 mg/dL (ref 8.9–10.3)
Chloride: 102 mmol/L (ref 98–111)
Creatinine, Ser: 0.7 mg/dL (ref 0.44–1.00)
GFR, Estimated: >60 mL/min (ref >60)
Glucose, Bld: 231 mg/dL — ABNORMAL HIGH (ref 70–99)
Potassium: 3.5 mmol/L (ref 3.5–5.1)
Sodium: 138 mmol/L (ref 135–145)

## 2021-12-15 LAB — TROPONIN I (HIGH SENSITIVITY)
Troponin I (High Sensitivity): 4 ng/L (ref <18)
Troponin I (High Sensitivity): 3 ng/L (ref <18)

## 2021-12-15 NOTE — ED Notes (Signed)
Patient was seen walking out

## 2021-12-15 NOTE — ED Provider Triage Note (Signed)
Emergency Medicine Provider Triage Evaluation Note  Annette Cabrera , a 61 y.o. female  was evaluated in triage.  Pt complains of arm pain. Report pain from R side of neck radiates throughout entire R arm since this morning.  Also report indigestion intermittently for the past few week. Some nausea.  No fever, chills, cough, sob. Thought she may have slept wrong causing arm pain  Review of Systems  Positive: As above Negative: As above  Physical Exam  Ht '5\' 2"'$  (1.575 m)   Wt 116.7 kg   LMP 09/06/2011 Comment: 5 years ago   BMI 47.06 kg/m  Gen:   Awake, no distress   Resp:  Normal effort  MSK:   Moves extremities without difficulty  Other:    Medical Decision Making  Medically screening exam initiated at 4:32 PM.  Appropriate orders placed.  Annette Cabrera was informed that the remainder of the evaluation will be completed by another provider, this initial triage assessment does not replace that evaluation, and the importance of remaining in the ED until their evaluation is complete.     Domenic Moras, PA-C 12/15/21 1637

## 2021-12-15 NOTE — ED Triage Notes (Signed)
The pt c/o rt neck pain rt shoulder and rt arm when she woke up this am  no known injury no falls  she has taken aleve with no improvement  lmp none

## 2021-12-16 ENCOUNTER — Encounter (HOSPITAL_COMMUNITY): Payer: Self-pay | Admitting: Emergency Medicine

## 2021-12-16 ENCOUNTER — Ambulatory Visit (HOSPITAL_COMMUNITY)
Admission: EM | Admit: 2021-12-16 | Discharge: 2021-12-16 | Disposition: A | Discharge status: Home / Self Care | Payer: No Typology Code available for payment source | Attending: Internal Medicine | Admitting: Internal Medicine

## 2021-12-16 DIAGNOSIS — S46911A Strain of unspecified muscle, fascia and tendon at shoulder and upper arm level, right arm, initial encounter: Secondary | ICD-10-CM | POA: Diagnosis not present

## 2021-12-16 MED ORDER — NAPROXEN 500 MG PO TABS: 500.0000 mg | ORAL_TABLET | Freq: Two times a day (BID) | ORAL | 0 refills | Status: DC

## 2021-12-16 MED ORDER — METHOCARBAMOL 500 MG PO TABS: 500.0000 mg | ORAL_TABLET | Freq: Two times a day (BID) | ORAL | 0 refills | Status: DC

## 2021-12-16 NOTE — ED Triage Notes (Signed)
Patient c/o RT shoulder pain that started yesterday.   Patient endorses pain has progressively become worst.   Patient reports going to ED room yesterday and having some test done.   Patient endorses increased pain with active ROM.   Patient denies trauma or fall.   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   Patient has taken Aleve and hot compress with no relief of symptoms.

## 2021-12-16 NOTE — ED Provider Notes (Signed)
[facility]    CSN: [id number] Arrival date & time: 12/16/21  0801      History   Chief Complaint Chief Complaint  Patient presents with   Shoulder Pain    HPI Annette Cabrera is a 61 y.o. female.   Patient presents to urgent care for evaluation of her right neck pain that radiates to her right shoulder and pectoralis muscle.  Reporting that she woke up yesterday and felt a "crick in her neck". She states that she is visiting her son and is currently sleeping on the couch, but this is not out of the ordinary for her.  Pain to her right chest and shoulder is currently a 7 on a scale of 0-10 and she describes it as intermittent stabbing/spasm pain.  She attempted use of heat to the area yesterday as well as taking naproxen prior to going to the emergency department for evaluation last night.  She states that the pain was concerning for a possible heart problem and she wanted to go to the ER to get this ruled out.  She left the ER without being seen after waiting for 5 hours, but states that they did an EKG and it was "normal".  She has a sedentary job and denies lifting heavy objects that could cause strain to her muscles.  She has a significant medical history of diabetes, hypertension, asthma, hyperlipidemia, and sleep apnea. No numbness or tingling to her bilateral upper extremities reported.  No dizziness, diaphoresis, left sided chest pain, headache, URI symptoms, urinary symptoms, nausea, vomiting, or abdominal pain.  Patient has not taken any medications for her pain this morning.  No other aggravating or relieving factors identified at this time for her symptoms.   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   Left knee pain 08/16/2019   Acute non-recurrent maxillary sinusitis 02/07/2019   Retrocalcaneal bursitis (back of heel), right 12/06/2018   Achilles tendonosis of right lower extremity 12/06/2018   Perineal irritation 01/31/2018   OSA (obstructive sleep apnea) 05/10/2017   Shoulder pain 04/21/2017   Vitamin D deficiency 04/21/2017   Morbid obesity ([place]) 04/03/2017   DOE (dyspnea on exertion) 04/03/2017   Hot flashes 03/24/2017   Tachycardia 03/24/2017   Plantar fasciitis 03/24/2017   Sleep difficulties 05/10/2016   GERD (gastroesophageal reflux disease) 05/10/2016   Asthma 05/10/2016   Hyperlipidemia 05/09/2016   Diabetes type 2, controlled ([place]) 09/13/2015   Meniere's disease 09/16/2013   Allergic rhinitis 05/30/2013   Hypertension 04/24/2013    Past Surgical History:  Procedure Laterality Date   NOVASURE ABLATION  2009   TUBAL LIGATION  2000    OB History   No obstetric history on file.      Home Medications    Prior to Admission medications   Medication Sig Start Date End Date Taking? Authorizing Provider  amLODipine (NORVASC) 10 MG tablet TAKE 1 TABLET BY MOUTH EVERY DAY 09/27/21  Yes  Leeanne Rio, MD  aspirin EC 81 MG tablet Take 2 tablets (162 mg total) by mouth daily. 04/03/17  Yes Revankar, Reita Cliche, MD  atorvastatin (LIPITOR) 40 MG tablet TAKE 1 TABLET BY MOUTH EVERY DAY 07/23/21  Yes Hensel, Jamal Collin, MD  hydrochlorothiazide (HYDRODIURIL) 25 MG tablet TAKE 1 TABLET BY MOUTH EVERY DAY 08/03/21  Yes Pray, Norwood Levo, MD  Insulin Glargine (BASAGLAR KWIKPEN) 100 UNIT/ML INJECT 30 UNITS INTO THE SKIN DAILY. 05/19/21  Yes Leeanne Rio, MD  KLOR-CON M20 20 MEQ tablet TAKE 2 TABLETS BY MOUTH EVERY DAY 10/12/21  Yes Leeanne Rio, MD   lisinopril (ZESTRIL) 20 MG tablet TAKE 1 TABLET BY MOUTH EVERY DAY 10/12/21  Yes Leeanne Rio, MD  metFORMIN (GLUCOPHAGE-XR) 500 MG 24 hr tablet TAKE 2 TABLETS BY MOUTH TWICE A DAY 08/23/21  Yes Leeanne Rio, MD  methocarbamol (ROBAXIN) 500 MG tablet Take 1 tablet (500 mg total) by mouth 2 (two) times daily. 12/16/21  Yes Talbot Grumbling, FNP  naproxen (NAPROSYN) 500 MG tablet Take 1 tablet (500 mg total) by mouth 2 (two) times daily. 12/16/21  Yes Talbot Grumbling, FNP  albuterol (VENTOLIN HFA) 108 (90 Base) MCG/ACT inhaler TAKE 2 PUFFS BY MOUTH EVERY 6 HOURS AS NEEDED FOR WHEEZE OR SHORTNESS OF BREATH 07/21/20   Leeanne Rio, MD  BD PEN NEEDLE NANO 2ND GEN 32G X 4 MM MISC USE TO INJECT INSULIN 07/28/21   Lenoria Chime, MD  Blood Glucose Monitoring Suppl [facility] VERIO) w/Device KIT Check blood sugar once daily 11/18/21   Leeanne Rio, MD  fexofenadine (ALLEGRA) 180 MG tablet Take 180 mg by mouth daily.    [provider]  glucose blood (ONETOUCH VERIO) test strip Check blood sugar once daily 11/18/21   Leeanne Rio, MD  Lancet Devices (ONE TOUCH DELICA LANCING DEV) MISC Check blood sugar once daily 11/18/21   Leeanne Rio, MD  mometasone (NASONEX) 50 MCG/ACT nasal spray Place 2 sprays into the nose daily. 05/09/16   Leeanne Rio, MD  OneTouch Delica Lancets 93A MISC Check blood sugar once daily 11/18/21   Leeanne Rio, MD  Semaglutide,0.25 or 0.5MG/DOS, 2 MG/1.5ML SOPN Inject 0.25 mg into the skin once a week. 0.25 mg once weekly for 4 weeks then increase to 0.5 mg weekly for at least 4 weeks,max 1 mg 11/18/21   Leeanne Rio, MD  Zoster Vaccine Adjuvanted [facility]) injection Administer Shingrix vaccination now and repeat in two months 11/18/21   Leeanne Rio, MD  potassium chloride 20 MEQ TBCR Take 40 mEq by mouth daily. 05/14/13 07/24/13  Leeanne Rio, MD    Family History Family History  Problem  Relation Age of Onset   Diabetes Mother    Diabetes Father    Lung cancer Father 8       smoked for many years, died at 36 of lung cancer   Heart disease Father    Fibroids Sister    Diabetes Brother    Cerebral aneurysm Brother     Social History Social History   Tobacco Use   Smoking status: Never   Smokeless tobacco: Never  Vaping Use   Vaping Use: Never used  Substance Use Topics   Alcohol use: No   Drug use: No     Allergies   Penicillins   Review of Systems Review of Systems Per HPI  Physical Exam Triage Vital Signs ED Triage Vitals  Enc Vitals  Group     BP 12/16/21 0819 138/82     Pulse Rate 12/16/21 0819 93     Resp 12/16/21 0819 16     Temp 12/16/21 0819 98.1 F (36.7 C)     Temp Source 12/16/21 0819 Oral     SpO2 12/16/21 0819 96 %     Weight --      Height --      Head Circumference --      Peak Flow --      Pain Score 12/16/21 0821 10     Pain Loc --      Pain Edu? --      Excl. in [place]? --    No data found.  Updated Vital Signs BP 138/82 (BP Location: Left Arm)   Pulse 93   Temp 98.1 F (36.7 C) (Oral)   Resp 16   LMP 09/06/2011 Comment: 5 years ago   SpO2 96%   Visual Acuity Right Eye Distance:   Left Eye Distance:   Bilateral Distance:    Right Eye Near:   Left Eye Near:    Bilateral Near:     Physical Exam Vitals and nursing note reviewed.  Constitutional:      Appearance: Normal appearance. She is not ill-appearing or toxic-appearing.     Comments: Very pleasant patient sitting on exam in position of comfort table in no acute distress.   HENT:     Head: Normocephalic and atraumatic.     Right Ear: Hearing, tympanic membrane, ear canal and external ear normal.     Left Ear: Hearing, tympanic membrane, ear canal and external ear normal.     Nose: Nose normal.     Mouth/Throat:     Lips: Pink.     Mouth: Mucous membranes are moist.  Eyes:     General: Lids are normal. Vision grossly intact. Gaze aligned appropriately.      Extraocular Movements: Extraocular movements intact.     Conjunctiva/sclera: Conjunctivae normal.  Pulmonary:     Effort: Pulmonary effort is normal.  Abdominal:     General: Bowel sounds are normal.     Palpations: Abdomen is soft.     Tenderness: There is no abdominal tenderness. There is no right CVA tenderness, left CVA tenderness or guarding.  Musculoskeletal:        General: Tenderness present. No swelling, deformity or signs of injury.     Right shoulder: No swelling or bony tenderness. Normal range of motion. Normal strength. Normal pulse.     Left shoulder: Normal.     Cervical back: Normal and neck supple.     Thoracic back: Normal.     Lumbar back: Normal.     Comments: Full active range of motion present to right shoulder, although some tenderness with this to the right pectoralis muscle.  Pain to palpation of the right trapezius muscle with palpation.  Right-sided chest pain reproducible with palpation to the right pectoralis muscle.  No bruising, injury, or deformity visualized.  +2 radial pulses bilaterally.  No crepitus with active range of motion palpated.  There is no cervical, thoracic, or lumbar spinal tenderness.  Mild paraspinal cervical tenderness to palpation radiating to the muscles of the right shoulder.  5/5 grip strength to bilateral upper extremities present.  Skin:    General: Skin is warm and dry.     Capillary Refill: Capillary refill takes less than 2 seconds.     Findings: No rash.  Neurological:  General: No focal deficit present.     Mental Status: She is alert and oriented to person, place, and time. Mental status is at baseline.     Cranial Nerves: No dysarthria or facial asymmetry.     Gait: Gait is intact.  Psychiatric:        Mood and Affect: Mood normal.        Speech: Speech normal.        Behavior: Behavior normal.        Thought Content: Thought content normal.        Judgment: Judgment normal.     UC Treatments / Results   Labs (all labs ordered are listed, but only abnormal results are displayed) Labs Reviewed - No data to display  EKG   Radiology DG Chest 1 View  Result Date: 12/15/2021 CLINICAL DATA:  Chest pain. EXAM: CHEST  1 VIEW COMPARISON:  Chest x-ray February 08, 2011. FINDINGS: No consolidation. No visible pleural effusions or pneumothorax. Mild enlargement the cardiac silhouette, unchanged. No acute osseous abnormality. IMPRESSION: 1. No evidence of acute cardiopulmonary disease. 2. Mild cardiomegaly. Electronically Signed   By: Margaretha Sheffield M.D.   On: 12/15/2021 17:01    Procedures Procedures (including critical care time)  Medications Ordered in UC Medications - No data to display  Initial Impression / Assessment and Plan / UC Course  I have reviewed the triage vital signs and the nursing notes.  Pertinent labs & imaging results that were available during my care of the patient were reviewed by me and considered in my medical decision making (see chart for details).  1.  Strain of right shoulder Symptomology and physical exam are consistent with muscle strain etiology.  Right-sided chest pain is reproducible with palpation.  Work-up in the ER yesterday reassuring with negative troponin and EKG.  Naproxen twice daily for the next 2-3 days to reduce inflammation and pain, then as needed.  She is to take at this medication with food to avoid GI upset. She may use robaxin every 12 hours as needed for muscle spasm. No use of robaxin with alcohol or driving due to drowsiness side effect. Heat and gentle exercises discussed.   Discussed physical exam and available lab work findings in clinic with patient.  Counseled patient regarding appropriate use of medications and potential side effects for all medications recommended or prescribed today. Discussed red flag signs and symptoms of worsening condition,when to call the PCP office, return to urgent care, and when to seek higher level of care in the  emergency department. Patient verbalizes understanding and agreement with plan. All questions answered. Patient discharged in stable condition.   Final Clinical Impressions(s) / UC Diagnoses   Final diagnoses:  Strain of right shoulder, initial encounter     Discharge Instructions      Your pain is likely related to a shoulder muscle strain.  Take naproxen twice daily for the next 2 to 3 days to treat inflammation and pain.  After 2 to 3 days of consistent use of naproxen, you may use this as needed.   Take Robaxin muscle relaxer at nighttime as needed for muscle spasm.  Do not take this and drink alcohol or drive as it can make you sleepy.  Continue heat to your right shoulder and perform gentle range of motion exercises to prevent muscle stiffness.  If you develop any new or worsening symptoms or do not improve in the next 2 to 3 days, please return.  If your symptoms  are severe, please go to the emergency room.  Follow-up with your primary care provider for further evaluation and management of your symptoms as well as ongoing wellness visits.  I hope you feel better!     ED Prescriptions     Medication Sig Dispense Auth. Provider   naproxen (NAPROSYN) 500 MG tablet Take 1 tablet (500 mg total) by mouth 2 (two) times daily. 30 tablet Talbot Grumbling, FNP   methocarbamol (ROBAXIN) 500 MG tablet Take 1 tablet (500 mg total) by mouth 2 (two) times daily. 20 tablet Talbot Grumbling, FNP      PDMP not reviewed this encounter.   Talbot Grumbling, [place] 12/19/21 [phone]

## 2021-12-16 NOTE — Discharge Instructions (Signed)
Your pain is likely related to a shoulder muscle strain.  Take naproxen twice daily for the next 2 to 3 days to treat inflammation and pain.  After 2 to 3 days of consistent use of naproxen, you may use this as needed.   Take Robaxin muscle relaxer at nighttime as needed for muscle spasm.  Do not take this and drink alcohol or drive as it can make you sleepy.  Continue heat to your right shoulder and perform gentle range of motion exercises to prevent muscle stiffness.  If you develop any new or worsening symptoms or do not improve in the next 2 to 3 days, please return.  If your symptoms are severe, please go to the emergency room.  Follow-up with your primary care provider for further evaluation and management of your symptoms as well as ongoing wellness visits.  I hope you feel better!

## 2021-12-18 ENCOUNTER — Other Ambulatory Visit: Payer: Self-pay | Admitting: Family Medicine

## 2022-01-10 ENCOUNTER — Other Ambulatory Visit: Payer: Self-pay | Admitting: Family Medicine

## 2022-01-13 ENCOUNTER — Other Ambulatory Visit: Payer: Self-pay | Admitting: Family Medicine

## 2022-01-13 DIAGNOSIS — E119 Type 2 diabetes mellitus without complications: Secondary | ICD-10-CM

## 2022-02-12 ENCOUNTER — Other Ambulatory Visit: Payer: Self-pay | Admitting: Family Medicine

## 2022-03-03 ENCOUNTER — Other Ambulatory Visit: Payer: Self-pay | Admitting: Family Medicine

## 2022-03-04 NOTE — Telephone Encounter (Signed)
Please let patient know I am refilling this medication, but she needs to schedule an appointment with me.   Thanks, Clint Strupp J Kolby Myung, MD  

## 2022-04-20 DIAGNOSIS — H4901 Third [oculomotor] nerve palsy, right eye: Secondary | ICD-10-CM | POA: Diagnosis not present

## 2022-04-20 DIAGNOSIS — H43811 Vitreous degeneration, right eye: Secondary | ICD-10-CM | POA: Diagnosis not present

## 2022-04-27 ENCOUNTER — Ambulatory Visit (INDEPENDENT_AMBULATORY_CARE_PROVIDER_SITE_OTHER): Payer: No Typology Code available for payment source | Admitting: Student

## 2022-04-27 ENCOUNTER — Encounter: Payer: Self-pay | Admitting: Student

## 2022-04-27 VITALS — BP 132/79 | HR 94 | Ht 63.0 in | Wt 250.5 lb

## 2022-04-27 DIAGNOSIS — Z23 Encounter for immunization: Secondary | ICD-10-CM | POA: Diagnosis not present

## 2022-04-27 DIAGNOSIS — H43811 Vitreous degeneration, right eye: Secondary | ICD-10-CM

## 2022-04-27 DIAGNOSIS — Z794 Long term (current) use of insulin: Secondary | ICD-10-CM | POA: Diagnosis not present

## 2022-04-27 DIAGNOSIS — E1165 Type 2 diabetes mellitus with hyperglycemia: Secondary | ICD-10-CM

## 2022-04-27 LAB — POCT GLYCOSYLATED HEMOGLOBIN (HGB A1C): HbA1c, POC (controlled diabetic range): 7.7 % — AB (ref 0.0–7.0)

## 2022-04-27 NOTE — Assessment & Plan Note (Signed)
Patient reports compliance with insulin regimen, and has been taking ozempic at lowest dose. Was told by pharmacy she couldn't increase. Encouraged patient to increase dose by

## 2022-04-27 NOTE — Assessment & Plan Note (Signed)
Patient seen in ED for eye complaint, found to have PVD, benign condition. This condition can precede a retinal detachment, thus patient notified of warning signs of retinal detachment. Patient complains of 1-2 episodes of light flash, when moving from dark to light, but otherwise no issue. No change in floaters, or sensation of curtain falling over eyes. Patient has f/u appointment with [facility] next Wed.  -F/u with [facility] -Seek emergent medical care if  Increase in floaters  Shade falling over eyes  Flashing light

## 2022-04-27 NOTE — Patient Instructions (Addendum)
It was great to see you! Thank you for allowing me to participate in your care!  We saw you to discuss your ED visit about your eye. It sounds like things are going ok, and you haven't had any episodes like what initially sent you to the ED.   Seek immediate medical care if - Increase in floaters, flashing lights, and sensation of a curtain falling over the vision - Follow up with Eye doctor at [facility]  Diabetes: Your A1c is 7.7 today, its almost at goal (goal is less than 7)! We may be able to get there using the Ozempic.   Ozempic: Can increase from .25 to .50 after 1 month if no issues Can increase from .50 to .75 after 1 month, if no issues Can increase from .75 to 1 after 1 month, if no issues  Take care and seek immediate care sooner if you develop any concerns.   Dr. Holley Bouche, MD [place]

## 2022-04-27 NOTE — Progress Notes (Signed)
  SUBJECTIVE:   CHIEF COMPLAINT / HPI:   ED f/u for posterior vitreous detachment.  PVD is a part of the normal aging process and does not require urgent ophthalmic intervention. Conservative management, patient counseled regarding the warning signs of a possible retinal detachment/retinal tear, including an increase in floaters, flashing lights, and sensation of a curtain falling over the vision.   Optho Recommendations: In the absence of the development of new symptoms: repeat exam in 2-4 weeks, then 2-3 months, then 6 months. Patient has f/u appointment next Wednesday.   ED F/u for PVD Reports that she had eye trauma just before the eye problems started, that she had not told MD's about when she was seen in the ED. Notices that her vision is a little worse at the end of the day, also sees the bright flash happening when going from dark to light, but has only happened twice. Also noticing floater more, and bright/dark spots only twice, but no increase in floaters, and no shade falling over the eye.    Diabetes A1c 11/18/21: 10.1  A1c today 7.7 Meds: glargine 30 units daily, semaglutide 0.25 q wk   PERTINENT  PMH / PSH: DM, PVD   OBJECTIVE:  BP 132/79   Pulse 94   Ht '5\' 3"'$  (1.6 m)   Wt 250 lb 8 oz (113.6 kg)   LMP 09/06/2011 Comment: 5 years ago   SpO2 99%   BMI 44.37 kg/m  Physical Exam Eyes:     General: Lids are normal.        Right eye: No foreign body, discharge or hordeolum.     Extraocular Movements: Extraocular movements intact.     Conjunctiva/sclera:     Right eye: Right conjunctiva is not injected. No chemosis, exudate or hemorrhage.    Left eye: Left conjunctiva is not injected. No chemosis, exudate or hemorrhage.     ASSESSMENT/PLAN:  Uncontrolled type 2 diabetes mellitus with hyperglycemia, with long-term current use of insulin (HCC) -     POCT glycosylated hemoglobin (Hb A1C)  Posterior vitreous detachment of right eye Assessment & Plan: Patient seen in  ED for eye complaint, found to have PVD, benign condition. This condition can precede a retinal detachment, thus patient notified of warning signs of retinal detachment. Patient complains of 1-2 episodes of light flash, when moving from dark to light, but otherwise no issue. No change in floaters, or sensation of curtain falling over eyes. Patient has f/u appointment with [facility] next Wed.  -F/u with [facility] -Seek emergent medical care if  Increase in floaters  Shade falling over eyes  Flashing light    No follow-ups on file. Holley Bouche, MD 04/27/2022, 9:08 AM PGY-2, [place]

## 2022-05-06 DIAGNOSIS — H2513 Age-related nuclear cataract, bilateral: Secondary | ICD-10-CM | POA: Diagnosis not present

## 2022-05-06 DIAGNOSIS — H43811 Vitreous degeneration, right eye: Secondary | ICD-10-CM | POA: Diagnosis not present

## 2022-05-07 DIAGNOSIS — H524 Presbyopia: Secondary | ICD-10-CM | POA: Diagnosis not present

## 2022-05-07 DIAGNOSIS — Z0100 Encounter for examination of eyes and vision without abnormal findings: Secondary | ICD-10-CM | POA: Diagnosis not present

## 2022-05-31 ENCOUNTER — Other Ambulatory Visit: Payer: Self-pay | Admitting: Family Medicine

## 2022-06-01 ENCOUNTER — Encounter: Payer: Self-pay | Admitting: Family Medicine

## 2022-06-01 NOTE — Telephone Encounter (Signed)
Sent patient mychart msg clarifying current dose, will wait for that before refilling

## 2022-06-07 NOTE — Telephone Encounter (Signed)
Red team can you clarify with patient what dose of ozempic she is taking? I sent her a mychart message last week but she has not read it. I need to know her current dose before I can refill  Thanks Leeanne Rio, MD

## 2022-06-08 NOTE — Telephone Encounter (Signed)
Attempted to call pt no answer. Will try again later. Annette Cabrera Kennon Holter, CMA

## 2022-06-10 NOTE — Telephone Encounter (Signed)
Have not heard back from patient so will decline rx and put msg that she needs to contact us with current dose Leeanne Rio, MD

## 2022-07-18 ENCOUNTER — Other Ambulatory Visit: Payer: Self-pay | Admitting: Family Medicine

## 2022-09-09 ENCOUNTER — Other Ambulatory Visit: Payer: Self-pay | Admitting: Family Medicine

## 2022-09-12 NOTE — Telephone Encounter (Signed)
Please let patient know I am refilling this medication, but she needs to schedule an appointment with me.   Thanks, Ray Glacken J Robinson Brinkley, MD  

## 2022-09-13 NOTE — Telephone Encounter (Signed)
Called and scheduled appointment for check up.

## 2022-09-16 ENCOUNTER — Telehealth: Payer: Self-pay

## 2022-09-16 ENCOUNTER — Other Ambulatory Visit (HOSPITAL_COMMUNITY): Payer: Self-pay

## 2022-09-16 NOTE — Telephone Encounter (Signed)
Rec'd PA request for patients basaglar medication. Insurance prefers [employer], commercial.   Could rx for this medication be sent to pharmacy?

## 2022-09-19 MED ORDER — INSULIN GLARGINE 100 UNIT/ML SOLOSTAR PEN
30.0000 [IU] | PEN_INJECTOR | Freq: Every day | SUBCUTANEOUS | 0 refills | Status: DC
Start: 2022-09-19 — End: 2022-10-25

## 2022-09-19 NOTE — Telephone Encounter (Signed)
Rx sent in. Jennie Hannay J Myiesha Edgar, MD  

## 2022-09-19 NOTE — Addendum Note (Signed)
Addended by: Leeanne Rio on: 09/19/2022 08:58 AM   Modules accepted: Orders

## 2022-10-04 LAB — HM DIABETES EYE EXAM

## 2022-10-17 ENCOUNTER — Ambulatory Visit (INDEPENDENT_AMBULATORY_CARE_PROVIDER_SITE_OTHER): Payer: Medicaid Other | Admitting: Family Medicine

## 2022-10-17 ENCOUNTER — Other Ambulatory Visit: Payer: Self-pay

## 2022-10-17 VITALS — BP 147/82 | HR 84 | Ht 63.0 in | Wt 249.2 lb

## 2022-10-17 DIAGNOSIS — G8929 Other chronic pain: Secondary | ICD-10-CM | POA: Diagnosis not present

## 2022-10-17 DIAGNOSIS — I10 Essential (primary) hypertension: Secondary | ICD-10-CM | POA: Diagnosis not present

## 2022-10-17 DIAGNOSIS — M25511 Pain in right shoulder: Secondary | ICD-10-CM

## 2022-10-17 DIAGNOSIS — E118 Type 2 diabetes mellitus with unspecified complications: Secondary | ICD-10-CM | POA: Diagnosis not present

## 2022-10-17 LAB — POCT GLYCOSYLATED HEMOGLOBIN (HGB A1C): HbA1c, POC (controlled diabetic range): 9.4 % — AB (ref 0.0–7.0)

## 2022-10-17 MED ORDER — LISINOPRIL 40 MG PO TABS: 40.0000 mg | ORAL_TABLET | Freq: Every day | ORAL | 1 refills | Status: DC

## 2022-10-17 MED ORDER — SEMAGLUTIDE(0.25 OR 0.5MG/DOS) 2 MG/1.5ML ~~LOC~~ SOPN
0.2500 mg | PEN_INJECTOR | SUBCUTANEOUS | 3 refills | Status: DC
Start: 2022-10-17 — End: 2023-01-04

## 2022-10-17 NOTE — Patient Instructions (Signed)
It was great to see you again today.  Sending to sports medicine for your shoulder, you can also talk to them about your heel pain  Restart ozempic - sent it in for you 0.25mg  weekly for 4 weeks, then go up to 0.5mg  weekly.  Increase lisinopril to 40mg  daily Follow up in 1 week to see Dr. Raymondo Band & have labs checked  Follow up with me in 2 months.  Be well, Dr. Pollie Meyer

## 2022-10-17 NOTE — Assessment & Plan Note (Signed)
Pt has a 3 month hx of R shoulder pain. Likely a mix of degeneration, tendonitis, and nerve impingement. Recommended seeing sports medicine for ultrasound and potential injection.

## 2022-10-17 NOTE — Assessment & Plan Note (Signed)
A1c elevated to 9.4 today. Diabetes is not well controlled. Recommended restarting ozempic 0.25mg . Instructed to start checking blood sugars regularly. Follow up in 2 months.

## 2022-10-17 NOTE — Progress Notes (Unsigned)
     SUBJECTIVE:   CHIEF COMPLAINT / HPI:   Annette Cabrera is a 62 yo female who presents to clinic for follow up on DM2. She also reports R shoulder/arm and L heel pain.  DM2 Patient has been taking her lantus and metformin as prescribed. She has not taken her ozempic since February due to procedural issues at her pharmacy. Isn't checking sugar at home regularly. No dizziness or feeling faint. Some diarrhea, no blood no constipation, no ab pain.  R Shoulder Pain R arm pain, after came back from [place] in Feb, waking up from sleep, radiates down into hand, hurts more when laying on left, thinks maybe happened before but now worse, using arm lots to do chorse, worse at night,   L heel pain L heel pain: had it before, since October, worse at end of day, walking makes it worse, constant, aleve wasn't helping, 1 aleve and 1 ibuprofen helps some with heel but not so much shoulder, not taking every, try topical, uses lidocaine   PERTINENT  PMH / PSH: DM2, HTN, HLD, obesity, plantar fasciitis  OBJECTIVE:   BP (!) 147/82   Pulse 84   Ht 5\' 3"  (1.6 m)   Wt 249 lb 3.2 oz (113 kg)   LMP 09/06/2011 Comment: 5 years ago   SpO2 98%   BMI 44.14 kg/m    Gen: alert, well appearing, in no acute distress HEENT: normocephalic, atraumatic CV: RRR, no MRG Pulm: normal WOB, clear to auscultation bilaterally Ab: normoactive bowel sounds, no tenderness or masses Ext: no lower extremity edema MSK:  R arm: pain with palpation to bicipital groove and supraspinatus, pain with shoulder abduction, empty can, and lift off behind back, active ROM well above 90 degrees, radial pulses equal Neuro: normal sensation in extremities bilaterally  ASSESSMENT/PLAN:   Diabetes type 2, controlled (HCC) A1c elevated to 9.4 today. Diabetes is not well controlled. Recommended restarting ozempic 0.25mg . Instructed to start checking blood sugars regularly. Follow up in 2 months.  Hypertension BP elevated to 147/82  on repeat BP. She does not check her BP at home. Increased lisinopril to 40 mg. Follow up in 1 week to check BP and get labs.  Shoulder pain Pt has a 3 month hx of R shoulder pain. Likely a mix of degeneration, tendonitis, and nerve impingement. Recommended seeing sports medicine for ultrasound and potential injection.     Barrett Shell, Medical Student [facility]

## 2022-10-17 NOTE — Assessment & Plan Note (Signed)
BP elevated to 147/82 on repeat BP. She does not check her BP at home. Increased lisinopril to 40 mg. Follow up in 1 week to check BP and get labs.

## 2022-10-18 LAB — MICROALBUMIN / CREATININE URINE RATIO
Creatinine, Urine: 67.4 mg/dL
Microalb/Creat Ratio: 20 mg/g creat (ref 0–29)
Microalbumin, Urine: 13.8 ug/mL

## 2022-10-24 ENCOUNTER — Encounter: Payer: Self-pay | Admitting: Pharmacist

## 2022-10-24 ENCOUNTER — Ambulatory Visit
Admission: RE | Admit: 2022-10-24 | Discharge: 2022-10-24 | Disposition: A | Discharge status: Home / Self Care | Payer: Medicaid Other | Source: Ambulatory Visit | Attending: Sports Medicine | Admitting: Sports Medicine

## 2022-10-24 ENCOUNTER — Ambulatory Visit: Payer: Medicaid Other | Admitting: Sports Medicine

## 2022-10-24 ENCOUNTER — Other Ambulatory Visit: Payer: Self-pay | Admitting: Family Medicine

## 2022-10-24 ENCOUNTER — Ambulatory Visit: Payer: Medicaid Other | Admitting: Pharmacist

## 2022-10-24 VITALS — BP 140/73 | HR 94 | Ht 62.75 in | Wt 244.8 lb

## 2022-10-24 VITALS — BP 114/70 | Ht 63.0 in | Wt 244.0 lb

## 2022-10-24 DIAGNOSIS — I10 Essential (primary) hypertension: Secondary | ICD-10-CM

## 2022-10-24 DIAGNOSIS — M25511 Pain in right shoulder: Secondary | ICD-10-CM | POA: Diagnosis not present

## 2022-10-24 DIAGNOSIS — M25572 Pain in left ankle and joints of left foot: Secondary | ICD-10-CM | POA: Diagnosis not present

## 2022-10-24 DIAGNOSIS — E118 Type 2 diabetes mellitus with unspecified complications: Secondary | ICD-10-CM | POA: Diagnosis not present

## 2022-10-24 MED ORDER — METHYLPREDNISOLONE ACETATE 40 MG/ML IJ SUSP
40.0000 mg | Freq: Once | INTRAMUSCULAR | Status: AC
  Administered 2022-10-24: 40 mg via INTRA_ARTICULAR

## 2022-10-24 NOTE — Patient Instructions (Signed)
It was nice to see you today!  Your goal blood pressure is <130/80  mmHg.  Medication Changes: Continue all medications as prescribed today.   Begin testing blood sugar multiple times per week.    Monitor blood pressure at home daily and keep a log (on your phone or piece of paper) to bring with you to your next visit. Write down date, time, blood pressure and pulse.  Keep up the good work with diet and exercise. Aim for a diet full of vegetables, fruit and lean meats (chicken, [place], fish). Try to limit salt intake by eating fresh or frozen vegetables (instead of canned), rinse canned vegetables prior to cooking and do not add any additional salt to meals.

## 2022-10-24 NOTE — Progress Notes (Signed)
S:     Chief Complaint  Patient presents with   Medication Management    Hypertension and diabetes   62 y.o. female who presents for hypertension evaluation, education, and management.  PMH is significant for significant for Hypertension and diabetes.   Patient was referred and last seen by Primary Care Provider, Dr. Pollie Meyer, on 10/17/2022.   At last visit, Patient non-adherence was addressed following an elevated A1C reading and elevated blood pressure.   Today, patient arrives in good spirits and presents without assistance. Denies dizziness, headache, blurred vision, swelling.   Family/Social history: Recently graduation of her youngest daughter from law school.  Medication adherence remains suboptimal - patient has not yet picked-up her semaglutide but plans to pick-up later tdoay . Patient has taken BP medications today.   Current antihypertensives include: amlodipine 10mg , Lisinopril 40mg , and HCTZ 25mg    ASCVD risk factors include: Weight, Diabetes  O:  Review of Systems  Gastrointestinal:  Positive for abdominal pain (discomfort since restarting medications).  All other systems reviewed and are negative.   Physical Exam Constitutional:      Appearance: Normal appearance.  Pulmonary:     Effort: Pulmonary effort is normal.  Neurological:     Mental Status: She is alert.  Psychiatric:        Mood and Affect: Mood normal.        Behavior: Behavior normal.        Thought Content: Thought content normal.     Last 3 Office BP readings: BP Readings from Last 3 Encounters:  10/24/22 (!) 140/73  10/17/22 (!) 147/82  04/27/22 132/79    BMET    Component Value Date/Time   NA 138 12/15/2021 1643   NA 140 07/21/2021 0907   K 3.5 12/15/2021 1643   CL 102 12/15/2021 1643   CO2 23 12/15/2021 1643   GLUCOSE 231 (H) 12/15/2021 1643   BUN 8 12/15/2021 1643   BUN 7 07/21/2021 0907   CREATININE 0.70 12/15/2021 1643   CREATININE 0.66 08/09/2016 0918   CALCIUM 9.3  12/15/2021 1643   GFRNONAA >60 12/15/2021 1643   GFRNONAA >89 08/09/2016 0918   GFRAA 111 07/21/2020 1136   GFRAA >89 08/09/2016 0918    Renal function: CrCl cannot be calculated (Patient's most recent lab result is older than the maximum 21 days allowed.).  Clinical ASCVD: No  The 10-year ASCVD risk score (Arnett DK, et al., 2019) is: 13.5%   Values used to calculate the score:     Age: 61 years     Sex: Female     Is Non-Hispanic African American: Yes     Diabetic: Yes     Tobacco smoker: No     Systolic Blood Pressure: 140 mmHg     Is BP treated: Yes     HDL Cholesterol: 69 mg/dL     Total Cholesterol: 148 mg/dL  Patient is participating in a Managed Medicaid Plan:  Yes    A/P: Hypertension diagnosed in her late 40s or early 16s and currently remains greater than goal on current medications. BP goal < 130/80 mmHg. Medication adherence appears good recently. -Continued Amlodipine 10mg , Lisinopril 40mg , and HCTZ 25mg .  Consider addition of new agent depending on lab work obtained today.  History of low potassium and use of Potassium supplement may make MRA next option. -Patient educated on purpose, proper use, and potential adverse effects. -F/u labs ordered by Dr. Pollie Meyer - BMET -Counseled on lifestyle modifications for blood pressure control including  reduced dietary sodium, increased exercise, adequate sleep.   Written patient instructions provided. Patient verbalized understanding of treatment plan.  Total time in face to face counseling 25 minutes.    Follow-up:  Pharmacist PRN. PCP clinic visit in 12/12/2022.  Patient seen with Haze Boyden PharmD Candidate.

## 2022-10-24 NOTE — Assessment & Plan Note (Signed)
Hypertension diagnosed in her late 3s or early 99s and currently remains greater than goal on current medications. BP goal < 130/80 mmHg. Medication adherence appears good recently. -Continued Amlodipine 10mg , Lisinopril 40mg , and HCTZ 25mg .  Consider addition of new agent depending on lab work obtained today.  History of low potassium and use of Potassium supplement may make MRA next option. -Patient educated on purpose, proper use, and potential adverse effects. -F/u labs ordered by Dr. Pollie Meyer - BMET -Counseled on lifestyle modifications for blood pressure control including reduced dietary sodium, increased exercise, adequate sleep.

## 2022-10-24 NOTE — Progress Notes (Signed)
   Subjective:    Patient ID: Annette Cabrera, female    DOB: 1961/03/31, 62 y.o.   MRN: [id number]  HPI chief complaint: Right shoulder and left heel pain  Patient is a very pleasant 62 year old female that presents today with a couple of different complaints.  First complaint is left heel pain that began after an ankle injury last fall.  She suffered an inversion injury to the ankle and since then she has had constant heel pain that is diffuse.  She thought at first she had Planter fasciitis but her pain is different in nature than what she experienced with that.  She denies any ankle pain.  Pain does not radiate.  It is much worse with weightbearing but is also present at rest. She is also complaining of right shoulder pain that began back in March without any known injury.  Her pain is in the lateral shoulder but will radiate down the right arm into the right hand.  She denies any neck pain.  This pain is most noticeable at night.  She does get some relief when raising her arm over her head when sleeping.  She denies similar problems with this arm in the past.  No prior shoulder surgeries.  Past medical history reviewed Medications reviewed Allergies reviewed    Review of Systems As above    Objective:   Physical Exam  Well-developed, well-nourished.  No acute distress  Right shoulder: There is full shoulder range of motion with a positive painful arc.  No tenderness to palpation.  Positive empty can, positive Hawkins.  Rotator cuff strength is 5/5 but does reproduce pain with resisted supraspinatus.  Cervical spine: Full painless cervical range of motion.  Negative Spurling's.  Left heel: There is tenderness to palpation diffusely throughout the calcaneus.  No soft tissue swelling.  No tenderness to palpation over the distal fibula.  Negative talar tilt, negative anterior drawer.  No tenderness over the medial malleolus.  Good pulses.      Assessment & Plan:   Right shoulder  pain likely secondary to rotator cuff tendinitis/subacromial bursitis Chronic left heel pain status post left ankle injury  I recommended a subacromial cortisone injection for the right shoulder today.  This is accomplished atraumatically under sterile technique.  She tolerates this without difficulty.  I would like to get x-rays of both her right shoulder as well as her left ankle and heel.  I will call her with those results when available.  I may recommend formal physical therapy depending on what the x-rays show.  Consent obtained and verified. Time-out conducted. Noted no overlying erythema, induration, or other signs of local infection. Skin prepped in a sterile fashion. Topical analgesic spray: Ethyl chloride. Joint: Right shoulder, subacromial Needle: 1.5 inch 25-gauge Completed without difficulty. Meds: 3 cc 1% Xylocaine, 1 cc (40 mg) Depo-Medrol  Advised to call if fevers/chills, erythema, induration, drainage, or persistent bleeding.   This note was dictated using Dragon naturally speaking software and may contain errors in syntax, spelling, or content which have not been identified prior to signing this note.

## 2022-10-26 ENCOUNTER — Ambulatory Visit: Payer: Medicaid Other | Admitting: Sports Medicine

## 2022-10-26 LAB — BASIC METABOLIC PANEL
BUN/Creatinine Ratio: 13 (ref 12–28)
BUN: 9 mg/dL (ref 8–27)
Creatinine, Ser: 0.67 mg/dL (ref 0.57–1.00)
Glucose: 270 mg/dL — ABNORMAL HIGH (ref 70–99)
eGFR: 100 mL/min/{1.73_m2} (ref >59)

## 2022-10-27 ENCOUNTER — Telehealth: Payer: Self-pay

## 2022-10-27 NOTE — Telephone Encounter (Signed)
A Prior Authorization was initiated for this patients OZEMPIC through CoverMyMeds.   Key: [id number]

## 2022-10-28 NOTE — Telephone Encounter (Signed)
Prior Auth for patients medication OZEMPIC approved by [employer] from 10/27/22 to 10/27/23.  CoverMyMeds Key: [id number]

## 2022-10-31 ENCOUNTER — Telehealth: Payer: Self-pay | Admitting: Sports Medicine

## 2022-10-31 NOTE — Progress Notes (Signed)
Reviewed and agree with Dr Koval's plan.   

## 2022-10-31 NOTE — Telephone Encounter (Signed)
I spoke with the patient on the phone today after reviewing x-rays of her ankle, calcaneus, and right shoulder.  She has a calcaneal spur at the bottom of the left calcaneus.  No acute ankle injury is seen.  Her shoulder x-ray does show some mild glenohumeral and acromioclavicular degenerative changes as well as a small subacromial spur.  In addition, there is also some subcortical cystic change in the lateral humeral head likely secondary to rotator cuff impingement.  Her shoulder and her heel are feeling a little bit better.  I discussed the possibility of trying some physical therapy but she would like to hold on that for now.  She has a follow-up appointment with me again in 3 weeks.  Of note, if we decide to start physical therapy we will want to do that in her hometown of [place].

## 2022-11-04 ENCOUNTER — Encounter: Payer: Self-pay | Admitting: Family Medicine

## 2022-11-10 ENCOUNTER — Telehealth: Payer: Self-pay

## 2022-11-10 NOTE — Telephone Encounter (Signed)
Patient calls nurse line regarding Ozempic prescription.   She reports that she has accidentally injected Ozempic 0.25 mg daily for the last three days. She has also been taking her daily 30 units of Lantus.   CBG this AM was 192. This was taken after breakfast. She ate breakfast sandwich, a few grapes and cantaloupe.   Patient reports that she feels a "little tired."  She denies any other symptoms at this time. No GI upset at this time.   Spoke with Dr. Raymondo Band regarding patient. Advised to restart Ozempic one week from last injection. Also advised that if CBG drops below 100, to decrease lantus to 24 units.   Provided patient with this information. Patient verbalizes understanding and has no further questions at this time. Reiterated hypoglycemic protocol and ED precautions.   Patient rechecked CBG prior to getting off the phone. CBG was 159.  Veronda Prude, RN

## 2022-11-10 NOTE — Telephone Encounter (Signed)
Reviewed and agree with Dr Koval's plan.   

## 2022-11-15 ENCOUNTER — Other Ambulatory Visit: Payer: Self-pay | Admitting: Family Medicine

## 2022-11-15 ENCOUNTER — Ambulatory Visit: Payer: Medicaid Other | Admitting: Sports Medicine

## 2022-12-12 ENCOUNTER — Ambulatory Visit: Payer: Medicaid Other | Admitting: Family Medicine

## 2022-12-25 ENCOUNTER — Other Ambulatory Visit: Payer: Self-pay | Admitting: Family Medicine

## 2023-01-03 ENCOUNTER — Other Ambulatory Visit: Payer: Self-pay | Admitting: Family Medicine

## 2023-01-04 ENCOUNTER — Ambulatory Visit: Payer: No Typology Code available for payment source | Admitting: Family Medicine

## 2023-01-04 ENCOUNTER — Encounter: Payer: Self-pay | Admitting: Family Medicine

## 2023-01-04 VITALS — BP 118/80 | HR 98 | Ht 63.0 in | Wt 244.0 lb

## 2023-01-04 DIAGNOSIS — E119 Type 2 diabetes mellitus without complications: Secondary | ICD-10-CM

## 2023-01-04 DIAGNOSIS — I10 Essential (primary) hypertension: Secondary | ICD-10-CM | POA: Diagnosis not present

## 2023-01-04 DIAGNOSIS — M791 Myalgia, unspecified site: Secondary | ICD-10-CM | POA: Diagnosis not present

## 2023-01-04 DIAGNOSIS — Z794 Long term (current) use of insulin: Secondary | ICD-10-CM | POA: Diagnosis not present

## 2023-01-04 DIAGNOSIS — Z1231 Encounter for screening mammogram for malignant neoplasm of breast: Secondary | ICD-10-CM

## 2023-01-04 LAB — POCT GLYCOSYLATED HEMOGLOBIN (HGB A1C): HbA1c, POC (controlled diabetic range): 7.5 % — AB (ref 0.0–7.0)

## 2023-01-04 MED ORDER — OZEMPIC (1 MG/DOSE) 4 MG/3ML ~~LOC~~ SOPN: 1.0000 mg | PEN_INJECTOR | SUBCUTANEOUS | 3 refills | Status: DC

## 2023-01-04 MED ORDER — SHINGRIX 50 MCG/0.5ML IM SUSR: INTRAMUSCULAR | 1 refills | Status: DC

## 2023-01-04 NOTE — Patient Instructions (Addendum)
It was great to see you again today.  Checking labs today Go up to 1mg  per week on ozempic  Take shingles vaccine to your pharmacy  Be well, Dr. Pollie Meyer  To schedule your mammogram, call the [facility] at [phone]. They are located at [address], [address].

## 2023-01-04 NOTE — Progress Notes (Signed)
  Date of Visit: 01/04/2023   SUBJECTIVE:   HPI:  Annette Cabrera presents today for routine follow-up.  Diabetes: Currently taking Lantus 30 units daily, metformin XR 1000 mg twice daily, and semaglutide 0.5 mg once a week.  Tolerating these medications well.  No known low sugars.  A1c today is 7.5.  Body pain: For several weeks to several months has had near constant pain in the muscles throughout her body.  Denies fevers or known injuries.  Hypertension: Currently taking amlodipine 10 mg daily, HCTZ 25 mg daily, and lisinopril 40 mg daily.  Tolerating these well.   OBJECTIVE:   BP 118/80   Pulse 98   Ht 5\' 3"  (1.6 m)   Wt 244 lb (110.7 kg)   LMP 09/06/2011 Comment: 5 years ago   SpO2 98%   BMI 43.22 kg/m  Gen: No acute distress, pleasant, cooperative, well-appearing HEENT: Normocephalic, atraumatic Heart: Regular rate and rhythm, no murmur Lungs: Clear to auscultation bilaterally, normal effort Neuro: Grossly nonfocal, speech normal, normal gait  ASSESSMENT/PLAN:   Health maintenance:  -Discussed shingles vaccine, prescription given to get at her pharmacy -Discussed mammogram, ordered and given instructions on how to schedule  Hypertension Well controlled. Continue current medication regimen.  Update BMET today.  Diabetes type 2, controlled (HCC) Improved with A1c of 7.5, though ideally want better control.  Increase Ozempic to 1 mg weekly.  Patient agreeable to this plan.  Muscle pain Unclear etiology by history.  Potentially fibromyalgia versus statin myopathy.  We will check labs today including CMET, lipids, CK, CBC, mag, sed rate.  If labs unremarkable consider trial off statin to see if she gets improvement.  FOLLOW UP: Follow up in 3 mos for next A1c  [place] J. Pollie Meyer, MD [facility]

## 2023-01-05 LAB — CMP14+EGFR
ALT: 13 IU/L (ref 0–32)
AST: 17 IU/L (ref 0–40)
Albumin: 4.3 g/dL (ref 3.8–4.9)
Alkaline Phosphatase: 109 IU/L (ref 44–121)
BUN/Creatinine Ratio: 16 (ref 12–28)
BUN: 10 mg/dL (ref 8–27)
Bilirubin Total: 1.2 mg/dL (ref 0.0–1.2)
CO2: 25 mmol/L (ref 20–29)
Calcium: 10 mg/dL (ref 8.7–10.3)
Chloride: 102 mmol/L (ref 96–106)
Creatinine, Ser: 0.64 mg/dL (ref 0.57–1.00)
Globulin, Total: 3 g/dL (ref 1.5–4.5)
Glucose: 82 mg/dL (ref 70–99)
Potassium: 3.7 mmol/L (ref 3.5–5.2)
Sodium: 142 mmol/L (ref 134–144)
Total Protein: 7.3 g/dL (ref 6.0–8.5)
eGFR: 101 mL/min/{1.73_m2} (ref >59)

## 2023-01-05 LAB — CBC
Hematocrit: 44.1 % (ref 34.0–46.6)
Hemoglobin: 14.6 g/dL (ref 11.1–15.9)
MCH: 29.5 pg (ref 26.6–33.0)
MCHC: 33.1 g/dL (ref 31.5–35.7)
MCV: 89 fL (ref 79–97)
Platelets: 317 10*3/uL (ref 150–450)
RBC: 4.95 x10E6/uL (ref 3.77–5.28)
RDW: 13 % (ref 11.7–15.4)
WBC: 8.2 10*3/uL (ref 3.4–10.8)

## 2023-01-05 LAB — CK: Total CK: 95 U/L (ref 32–182)

## 2023-01-05 LAB — LIPID PANEL
Chol/HDL Ratio: 2.9 ratio (ref 0.0–4.4)
Cholesterol, Total: 189 mg/dL (ref 100–199)
HDL: 66 mg/dL (ref >39)
LDL Chol Calc (NIH): 94 mg/dL (ref 0–99)
Triglycerides: 174 mg/dL — ABNORMAL HIGH (ref 0–149)
VLDL Cholesterol Cal: 29 mg/dL (ref 5–40)

## 2023-01-05 LAB — SEDIMENTATION RATE: Sed Rate: 24 mm/hr (ref 0–40)

## 2023-01-05 LAB — MAGNESIUM: Magnesium: 1.6 mg/dL (ref 1.6–2.3)

## 2023-01-06 NOTE — Assessment & Plan Note (Addendum)
Well controlled. Continue current medication regimen.  Update BMET today.

## 2023-01-06 NOTE — Assessment & Plan Note (Signed)
Improved with A1c of 7.5, though ideally want better control.  Increase Ozempic to 1 mg weekly.  Patient agreeable to this plan.

## 2023-01-18 ENCOUNTER — Telehealth: Payer: Self-pay | Admitting: Family Medicine

## 2023-01-18 NOTE — Telephone Encounter (Signed)
Attempted to reach patient by phone to discuss lab results, no answer. Left HIPAA-compliant voicemail asking her to call back at her convenience.  When she returns call please let her know: - Labs looked good, no explanation for muscle pains she's been having.  - She can try a period off her atorvastatin if she wants to see if it helps her feel better.  - If she is no better off the atorvastatin for 1-2 weeks, would recommend resuming it - If she is better off it, she can stay off it  - Either way should follow up with me in October.  Latrelle Dodrill, MD

## 2023-01-19 NOTE — Telephone Encounter (Signed)
Patient returns call to nurse line.   Patient advised of below.   Patient to FU in October with PCP.

## 2023-03-14 ENCOUNTER — Other Ambulatory Visit: Payer: Self-pay | Admitting: Family Medicine

## 2023-03-25 DIAGNOSIS — H524 Presbyopia: Secondary | ICD-10-CM | POA: Diagnosis not present

## 2023-03-25 DIAGNOSIS — E119 Type 2 diabetes mellitus without complications: Secondary | ICD-10-CM | POA: Diagnosis not present

## 2023-03-28 ENCOUNTER — Other Ambulatory Visit: Payer: Self-pay | Admitting: Family Medicine

## 2023-04-24 ENCOUNTER — Other Ambulatory Visit: Payer: Self-pay | Admitting: Family Medicine

## 2023-05-23 ENCOUNTER — Other Ambulatory Visit: Payer: Self-pay | Admitting: Family Medicine

## 2023-05-24 NOTE — Telephone Encounter (Signed)
Please let patient know I am refilling this medication, but she needs to schedule an appointment with me.   Thanks, Leeanne Rio, MD

## 2023-05-25 NOTE — Telephone Encounter (Signed)
Called and informed patient about medication and schedule appointment.   Thanks  [employer]

## 2023-06-06 ENCOUNTER — Encounter: Payer: Self-pay | Admitting: Family Medicine

## 2023-06-06 ENCOUNTER — Ambulatory Visit: Payer: 59 | Admitting: Family Medicine

## 2023-06-06 VITALS — BP 121/72 | HR 104 | Ht 63.0 in | Wt 245.8 lb

## 2023-06-06 DIAGNOSIS — Z23 Encounter for immunization: Secondary | ICD-10-CM | POA: Diagnosis not present

## 2023-06-06 DIAGNOSIS — Z0000 Encounter for general adult medical examination without abnormal findings: Secondary | ICD-10-CM | POA: Insufficient documentation

## 2023-06-06 DIAGNOSIS — E119 Type 2 diabetes mellitus without complications: Secondary | ICD-10-CM | POA: Diagnosis not present

## 2023-06-06 DIAGNOSIS — J4520 Mild intermittent asthma, uncomplicated: Secondary | ICD-10-CM

## 2023-06-06 DIAGNOSIS — E782 Mixed hyperlipidemia: Secondary | ICD-10-CM | POA: Diagnosis not present

## 2023-06-06 DIAGNOSIS — I10 Essential (primary) hypertension: Secondary | ICD-10-CM

## 2023-06-06 DIAGNOSIS — Z794 Long term (current) use of insulin: Secondary | ICD-10-CM | POA: Diagnosis not present

## 2023-06-06 LAB — POCT GLYCOSYLATED HEMOGLOBIN (HGB A1C): HbA1c, POC (controlled diabetic range): 6.7 % (ref 0.0–7.0)

## 2023-06-06 MED ORDER — ALBUTEROL SULFATE HFA 108 (90 BASE) MCG/ACT IN AERS
INHALATION_SPRAY | RESPIRATORY_TRACT | 3 refills | Status: AC
Start: 2023-06-06 — End: ?

## 2023-06-06 MED ORDER — ROSUVASTATIN CALCIUM 5 MG PO TABS: 5.0000 mg | ORAL_TABLET | Freq: Every day | ORAL | 3 refills | Status: DC

## 2023-06-06 NOTE — Progress Notes (Unsigned)
  Date of Visit: 06/06/2023   SUBJECTIVE:   HPI:  Annette Cabrera presents today for routine follow-up.  Diabetes: currently taking lantus 30u daily, metformin XR 1000mg  daily, ozempic 1mg  weekly.  Tolerating these medications well.  She is hesitant to increase the Ozempic due to wondering whether it is contributing to her muscle aches.  A1c today is 6.7.  Denies having any low sugars.  Muscle aches: improved greatly after stopping atorvastatin.  Willing to try a lower dose of Crestor to see if she can tolerate it.  Hypertension: Currently taking amlodipine 10 mg daily, HCTZ 25 mg daily, lisinopril 40 mg daily.  Tolerating these well.  Asthma: Requests refill of albuterol inhaler.  She got sick a month or 2 ago and felt like she needed her inhaler but did not have one that was not expired.  OBJECTIVE:   BP 121/72 (BP Location: Left Arm, Patient Position: Sitting, Cuff Size: Normal)   Pulse (!) 104   Ht 5\' 3"  (1.6 m)   Wt 245 lb 12.8 oz (111.5 kg)   LMP 09/06/2011 Comment: 5 years ago   SpO2 98%   BMI 43.54 kg/m  Gen: No acute distress, pleasant, cooperative, well-appearing HEENT: Normocephalic, atraumatic Heart: Regular rate and rhythm, no murmur Lungs: Clear to auscultation bilaterally, normal effort Neuro: Grossly nonfocal, speech normal   ASSESSMENT/PLAN:   Assessment & Plan Controlled type 2 diabetes mellitus without complication, with long-term current use of insulin (HCC) A1c well-controlled.  Continue current regimen. Mild intermittent asthma without complication Stable, refilled albuterol inhaler so she has it on hand when needed Mixed hyperlipidemia Improvement in muscle aches when stopping atorvastatin.  Trial of Crestor 5 mg daily to see if she can tolerate this.  She is amenable.  Prescription sent in. Primary hypertension Well-controlled, continue current regimen Morbid obesity (HCC) Discussed option of increasing dose of Ozempic to help with weight loss.  She is  hesitant to increase at this time.  We will continue her present dose as it is controlling her diabetes quite well. Routine adult health maintenance Given instructions on how to schedule mammogram Flu and COVID vaccines given today Reminded to get Shingrix vaccine at her pharmacy    FOLLOW UP: Follow up in 6 months for above issues  [place] J. Pollie Meyer, MD [facility]

## 2023-06-06 NOTE — Assessment & Plan Note (Signed)
Given instructions on how to schedule mammogram Flu and COVID vaccines given today Reminded to get Shingrix vaccine at her pharmacy

## 2023-06-06 NOTE — Assessment & Plan Note (Signed)
A1c well controlled.  Continue current regimen. 

## 2023-06-06 NOTE — Assessment & Plan Note (Signed)
Improvement in muscle aches when stopping atorvastatin.  Trial of Crestor 5 mg daily to see if she can tolerate this.  She is amenable.  Prescription sent in.

## 2023-06-06 NOTE — Assessment & Plan Note (Signed)
Stable, refilled albuterol inhaler so she has it on hand when needed

## 2023-06-06 NOTE — Patient Instructions (Signed)
It was great to see you again today.  Flu and COVID shots today  Get shingles vaccine at your pharmacy Sent in albuterol inhaler  Start crestor (rosuvastatin) 5mg  daily  To schedule your mammogram, call the [facility] at [phone]. They are located at [address].    Be well, Dr. Pollie Meyer

## 2023-06-06 NOTE — Assessment & Plan Note (Signed)
Well-controlled, continue current regimen 

## 2023-06-06 NOTE — Assessment & Plan Note (Signed)
Discussed option of increasing dose of Ozempic to help with weight loss.  She is hesitant to increase at this time.  We will continue her present dose as it is controlling her diabetes quite well.

## 2023-06-22 ENCOUNTER — Other Ambulatory Visit: Payer: Self-pay | Admitting: Family Medicine

## 2023-07-02 ENCOUNTER — Other Ambulatory Visit: Payer: Self-pay | Admitting: Family Medicine

## 2023-07-26 ENCOUNTER — Other Ambulatory Visit: Payer: Self-pay | Admitting: Family Medicine

## 2023-08-27 ENCOUNTER — Other Ambulatory Visit: Payer: Self-pay | Admitting: Family Medicine

## 2023-09-11 ENCOUNTER — Other Ambulatory Visit: Payer: Self-pay | Admitting: Family Medicine

## 2023-09-18 NOTE — Telephone Encounter (Signed)
 Marland Kitchen

## 2023-10-31 ENCOUNTER — Other Ambulatory Visit: Payer: Self-pay | Admitting: Family Medicine

## 2023-11-14 ENCOUNTER — Ambulatory Visit
Admission: RE | Admit: 2023-11-14 | Discharge: 2023-11-14 | Disposition: A | Discharge status: Home / Self Care | Source: Ambulatory Visit | Attending: Family Medicine | Admitting: Family Medicine

## 2023-11-14 ENCOUNTER — Encounter: Payer: Self-pay | Admitting: Family Medicine

## 2023-11-14 ENCOUNTER — Ambulatory Visit (INDEPENDENT_AMBULATORY_CARE_PROVIDER_SITE_OTHER): Admitting: Family Medicine

## 2023-11-14 VITALS — BP 139/83 | HR 98 | Ht 62.0 in | Wt 241.8 lb

## 2023-11-14 DIAGNOSIS — Z1231 Encounter for screening mammogram for malignant neoplasm of breast: Secondary | ICD-10-CM | POA: Diagnosis not present

## 2023-11-14 DIAGNOSIS — E118 Type 2 diabetes mellitus with unspecified complications: Secondary | ICD-10-CM | POA: Diagnosis not present

## 2023-11-14 DIAGNOSIS — Z23 Encounter for immunization: Secondary | ICD-10-CM

## 2023-11-14 DIAGNOSIS — Z0000 Encounter for general adult medical examination without abnormal findings: Secondary | ICD-10-CM | POA: Diagnosis not present

## 2023-11-14 DIAGNOSIS — J320 Chronic maxillary sinusitis: Secondary | ICD-10-CM

## 2023-11-14 LAB — POCT GLYCOSYLATED HEMOGLOBIN (HGB A1C): HbA1c, POC (controlled diabetic range): 6.9 % (ref 0.0–7.0)

## 2023-11-14 MED ORDER — DOXYCYCLINE HYCLATE 100 MG PO TABS: 100.0000 mg | ORAL_TABLET | Freq: Two times a day (BID) | ORAL | 0 refills | Status: DC

## 2023-11-14 MED ORDER — SHINGRIX 50 MCG/0.5ML IM SUSR: INTRAMUSCULAR | 1 refills | Status: AC

## 2023-11-14 NOTE — Progress Notes (Signed)
  Date of Visit: 11/14/2023   SUBJECTIVE:   HPI:  Annette Cabrera presents today for a well woman exam.   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 Denies low sugars.  OBJECTIVE:   BP 139/83   Pulse 98   Ht 5\' 2"  (1.575 m)   Wt 241 lb 12.8 oz (109.7 kg)   LMP 09/06/2011 Comment: 5 years ago   SpO2 100%   BMI 44.23 kg/m  Gen: NAD, pleasant, cooperative HEENT: NCAT, no palpable thyromegaly or anterior cervical lymphadenopathy. Tympanic membranes clear bilaterally. Oropharynx clear and moist Heart: RRR, no murmurs Lungs: CTAB, NWOB Neuro: grossly nonfocal, speech normal  ASSESSMENT/PLAN:   Assessment & Plan Routine adult health maintenance -STD screening: declines -pap smear: UTD -mammogram: ordered today & given instructions on how to schedule -lipid screening: UTD, on statin -colon cancer screening: UTD -lung cancer screening: n/a -DEXA: n/a -immunizations:  Flu: out of season Tdap: UTD Shingrix : given printed rx Pneumovax: PCV 20 given today -handout given on health maintenance topics Controlled diabetes mellitus type 2 with complications, unspecified whether long term insulin  use (HCC) A1c at goal, continue current medications.  UACR today Maxillary sinusitis, unspecified chronicity Rx doxycycline  as allergic to PCNs  FOLLOW UP: Follow up in 6 mos for diabetes   [place] J. Dawn Eth, MD [facility]

## 2023-11-14 NOTE — Patient Instructions (Addendum)
 It was great to see you again today.  Updating pneumonia shot today A1c looks great Checking urine protein Schedule your eye exam Get shingles vaccine at your pharmacy  To schedule your mammogram, call the [facility] at [phone]. They are located at [address], [address].     Be well, Dr. Dawn Eth

## 2023-11-14 NOTE — Assessment & Plan Note (Signed)
-  STD screening: declines -pap smear: UTD -mammogram: ordered today & given instructions on how to schedule -lipid screening: UTD, on statin -colon cancer screening: UTD -lung cancer screening: n/a -DEXA: n/a -immunizations:  Flu: out of season Tdap: UTD Shingrix : given printed rx Pneumovax: PCV 20 given today -handout given on health maintenance topics

## 2023-11-14 NOTE — Assessment & Plan Note (Signed)
 A1c at goal, continue current medications.  UACR today

## 2023-11-15 ENCOUNTER — Other Ambulatory Visit: Payer: Self-pay | Admitting: Family Medicine

## 2023-11-15 LAB — MICROALBUMIN / CREATININE URINE RATIO
Creatinine, Urine: 73.3 mg/dL
Microalb/Creat Ratio: <4 mg/g{creat} (ref 0–29)
Microalbumin, Urine: <3 ug/mL

## 2023-11-16 ENCOUNTER — Ambulatory Visit: Payer: Self-pay | Admitting: Family Medicine

## 2023-11-17 ENCOUNTER — Other Ambulatory Visit: Payer: Self-pay | Admitting: Family Medicine

## 2023-11-17 ENCOUNTER — Telehealth: Payer: Self-pay

## 2023-11-17 DIAGNOSIS — R928 Other abnormal and inconclusive findings on diagnostic imaging of breast: Secondary | ICD-10-CM

## 2023-11-17 NOTE — Telephone Encounter (Signed)
 Patient calls nurse line request to speak with PCP.   She reports she would like to discuss the results of her mammogram.   She reports the breast center is sending over additional orders for PCP to sign off on.   Advised will forward to PCP.

## 2023-11-17 NOTE — Telephone Encounter (Signed)
 Called patient, discussed results, reviewed next step is diagnostic mammo & US , has these scheduled for Monday Patient appreciative of call Candee Cha, MD

## 2023-11-20 ENCOUNTER — Ambulatory Visit
Admission: RE | Admit: 2023-11-20 | Discharge: 2023-11-20 | Disposition: A | Discharge status: Home / Self Care | Source: Ambulatory Visit | Attending: Family Medicine | Admitting: Family Medicine

## 2023-11-20 DIAGNOSIS — R928 Other abnormal and inconclusive findings on diagnostic imaging of breast: Secondary | ICD-10-CM

## 2023-11-20 DIAGNOSIS — N6012 Diffuse cystic mastopathy of left breast: Secondary | ICD-10-CM | POA: Diagnosis not present

## 2023-12-18 ENCOUNTER — Other Ambulatory Visit: Payer: Self-pay | Admitting: Family Medicine

## 2024-02-20 LAB — HM DIABETES EYE EXAM

## 2024-02-20 LAB — OPHTHALMOLOGY REPORT-SCANNED

## 2024-02-29 ENCOUNTER — Ambulatory Visit: Admitting: Family Medicine

## 2024-03-11 ENCOUNTER — Other Ambulatory Visit: Payer: Self-pay | Admitting: Family Medicine

## 2024-03-18 ENCOUNTER — Ambulatory Visit: Admitting: Family Medicine

## 2024-03-18 ENCOUNTER — Encounter: Payer: Self-pay | Admitting: Family Medicine

## 2024-03-18 VITALS — BP 134/74 | HR 105 | Ht 62.0 in | Wt 239.6 lb

## 2024-03-18 DIAGNOSIS — Z0000 Encounter for general adult medical examination without abnormal findings: Secondary | ICD-10-CM

## 2024-03-18 DIAGNOSIS — G8929 Other chronic pain: Secondary | ICD-10-CM

## 2024-03-18 DIAGNOSIS — I10 Essential (primary) hypertension: Secondary | ICD-10-CM

## 2024-03-18 DIAGNOSIS — E11621 Type 2 diabetes mellitus with foot ulcer: Secondary | ICD-10-CM | POA: Diagnosis not present

## 2024-03-18 DIAGNOSIS — E782 Mixed hyperlipidemia: Secondary | ICD-10-CM | POA: Diagnosis not present

## 2024-03-18 DIAGNOSIS — Z23 Encounter for immunization: Secondary | ICD-10-CM

## 2024-03-18 DIAGNOSIS — R5383 Other fatigue: Secondary | ICD-10-CM | POA: Diagnosis not present

## 2024-03-18 DIAGNOSIS — L97509 Non-pressure chronic ulcer of other part of unspecified foot with unspecified severity: Secondary | ICD-10-CM

## 2024-03-18 LAB — POCT GLYCOSYLATED HEMOGLOBIN (HGB A1C): HbA1c, POC (controlled diabetic range): 7.1 % — AB (ref 0.0–7.0)

## 2024-03-18 MED ORDER — OZEMPIC (2 MG/DOSE) 8 MG/3ML ~~LOC~~ SOPN: 2.0000 mg | PEN_INJECTOR | SUBCUTANEOUS | 3 refills | Status: DC

## 2024-03-18 MED ORDER — METFORMIN HCL ER 500 MG PO TB24: 1000.0000 mg | ORAL_TABLET | Freq: Every day | ORAL | Status: AC

## 2024-03-18 MED ORDER — GABAPENTIN 100 MG PO CAPS: 100.0000 mg | ORAL_CAPSULE | Freq: Every day | ORAL | 1 refills | Status: AC

## 2024-03-18 NOTE — Patient Instructions (Signed)
 It was great to see you again today.  Increase ozempic  to 2mg  weekly to help with diabetes and weight loss  Checking labs today  Start gabapentin 100mg  at bedtime to see if helps with nerve pain and sleep  Follow up in 3 months   Be well, Dr. Donah

## 2024-03-18 NOTE — Progress Notes (Unsigned)
  Date of Visit: 03/18/2024   SUBJECTIVE:   HPI:  Discussed the use of AI scribe software for clinical note transcription with the patient, who gave verbal consent to proceed.  History of Present Illness Annette Cabrera is a 63 year old female with diabetes who presents for a follow-up visit and general check-in.  Neuropathic and musculoskeletal pain - Nerve pain radiating down both sides, preventing sleep on her side; only able to sleep on her back without pain - Persistent shoulder pain - Suspects a pinched nerve in her back - Manages pain with over-the-counter medications and supplements - Concerned about potential interactions between pain management strategies and current medications  Menopausal symptoms - Sweating, fatigue, and lack of motivation to perform daily activities - Remains active and productive at work despite symptoms - History of ablation; no menstrual periods for a long time  Cutaneous eruptions - Development of pruritic pimples, which is unusual as she did not have pimples as a child or teenager  Recent immunizations - Received first dose of shingles vaccine - Received flu vaccine  Sinusitis and associated symptoms - Recent severe sinus infection causing dizziness and resulting in a fall with injury - Associates dizziness with sinus infections - Uses Nasonex  spray, but finds it less effective currently  Diabetes mellitus management - A1c currently 7.1 - Recently adjusted metformin  to 1000 mg once daily (previously twice daily) due to illness - Expresses concern about health and confidence in managing conditions as she ages      OBJECTIVE:   BP 134/74   Pulse (!) 105   Ht 5' 2 (1.575 m)   Wt 239 lb 9.6 oz (108.7 kg)   LMP 09/06/2011 Comment: 5 years ago   SpO2 100%   BMI 43.82 kg/m  Gen: *** HEENT: *** Heart: *** Lungs: *** Neuro: *** Ext: ***  ASSESSMENT/PLAN:   Assessment & Plan Controlled type 2 diabetes mellitus with foot  ulcer, unspecified whether long term insulin  use      Assessment and Plan Assessment & Plan 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 Neuropathic pain likely related to diabetes, affecting shoulder, back, and post-traumatic areas. - Increase Ozempic  to 2 mg weekly for weight management. - Prescribe gabapentin 100 mg at night for neuropathic pain.  Chronic multifocal pain (shoulder, back, post-traumatic) Chronic pain in shoulder, back, and post-traumatic areas, possibly nerve-related. - Prescribe gabapentin 100 mg at night for pain management.  Menopausal symptoms Symptoms suggestive of menopause, including sweating and fatigue. No recent periods post-ablation. - Order FSH level to assess menopausal status.  Chronic maxillary sinusitis Dizziness associated with sinus infections. Nasonex  spray ineffective. - Consider ENT referral if sinus symptoms persist.  Hypertension Managed with amlodipine , lisinopril , and HCTZ.  Hyperlipidemia Managed with rosuvastatin  (Crestor ).  Obesity Weight loss noted. - Increase Ozempic  to 2 mg weekly for further weight management.      FOLLOW UP: Follow up in *** for ***  [place] J. Donah, MD [facility]

## 2024-03-19 LAB — CBC
Hematocrit: 43.3 % (ref 34.0–46.6)
Hemoglobin: 14.2 g/dL (ref 11.1–15.9)
MCH: 29.8 pg (ref 26.6–33.0)
MCHC: 32.8 g/dL (ref 31.5–35.7)
MCV: 91 fL (ref 79–97)
Platelets: 282 x10E3/uL (ref 150–450)
RBC: 4.76 x10E6/uL (ref 3.77–5.28)
RDW: 12.6 % (ref 11.7–15.4)
WBC: 8.6 x10E3/uL (ref 3.4–10.8)

## 2024-03-19 LAB — BASIC METABOLIC PANEL WITH GFR
BUN/Creatinine Ratio: 12 (ref 12–28)
BUN: 8 mg/dL (ref 8–27)
CO2: 21 mmol/L (ref 20–29)
Calcium: 9.5 mg/dL (ref 8.7–10.3)
Chloride: 100 mmol/L (ref 96–106)
Creatinine, Ser: 0.65 mg/dL (ref 0.57–1.00)
Glucose: 137 mg/dL — ABNORMAL HIGH (ref 70–99)
Potassium: 3.8 mmol/L (ref 3.5–5.2)
Sodium: 141 mmol/L (ref 134–144)
eGFR: 99 mL/min/1.73 (ref >59)

## 2024-03-19 LAB — IRON,TIBC AND FERRITIN PANEL
Ferritin: 69 ng/mL (ref 15–150)
Iron Saturation: 14 % — ABNORMAL LOW (ref 15–55)
Iron: 49 ug/dL (ref 27–139)
Total Iron Binding Capacity: 346 ug/dL (ref 250–450)
UIBC: 297 ug/dL (ref 118–369)

## 2024-03-19 LAB — LIPID PANEL
Chol/HDL Ratio: 3 ratio (ref 0.0–4.4)
Cholesterol, Total: 178 mg/dL (ref 100–199)
HDL: 59 mg/dL (ref >39)
LDL Chol Calc (NIH): 84 mg/dL (ref 0–99)
Triglycerides: 208 mg/dL — ABNORMAL HIGH (ref 0–149)
VLDL Cholesterol Cal: 35 mg/dL (ref 5–40)

## 2024-03-19 LAB — FOLLICLE STIMULATING HORMONE: FSH: 13.9 m[IU]/mL — ABNORMAL LOW (ref 25.8–134.8)

## 2024-03-19 LAB — TSH RFX ON ABNORMAL TO FREE T4: TSH: 1.47 u[IU]/mL (ref 0.450–4.500)

## 2024-03-20 DIAGNOSIS — G8929 Other chronic pain: Secondary | ICD-10-CM | POA: Insufficient documentation

## 2024-03-20 NOTE — Assessment & Plan Note (Signed)
 Chronic pain in shoulder, back, and post-traumatic areas, possibly nerve-related. - Prescribe gabapentin 100 mg at night for pain management.

## 2024-03-20 NOTE — Assessment & Plan Note (Signed)
 A1c at 7.1 indicates good control. Neuropathic pain likely related to diabetes, affecting shoulder, back, and post-traumatic areas. - Increase Ozempic  to 2 mg weekly for weight management. - Prescribe gabapentin 100 mg at night for neuropathic pain.

## 2024-03-20 NOTE — Assessment & Plan Note (Signed)
 Update lipids, continue statin

## 2024-03-20 NOTE — Assessment & Plan Note (Signed)
 Flu shot today Normal foot exam today Requested eye exam records

## 2024-03-20 NOTE — Assessment & Plan Note (Signed)
 Well controlled. Continue current medication regimen.

## 2024-04-05 ENCOUNTER — Encounter: Payer: Self-pay | Admitting: Family Medicine

## 2024-04-28 ENCOUNTER — Other Ambulatory Visit: Payer: Self-pay | Admitting: Family Medicine

## 2024-05-07 ENCOUNTER — Other Ambulatory Visit: Payer: Self-pay | Admitting: Family Medicine

## 2024-05-29 ENCOUNTER — Encounter: Payer: Self-pay | Admitting: Family Medicine

## 2024-06-10 ENCOUNTER — Other Ambulatory Visit: Payer: Self-pay | Admitting: Family Medicine

## 2024-06-25 ENCOUNTER — Encounter: Payer: Self-pay | Admitting: Family Medicine

## 2024-06-25 ENCOUNTER — Ambulatory Visit (INDEPENDENT_AMBULATORY_CARE_PROVIDER_SITE_OTHER): Admitting: Family Medicine

## 2024-06-25 VITALS — BP 141/87 | HR 99 | Ht 62.0 in | Wt 234.0 lb

## 2024-06-25 DIAGNOSIS — E119 Type 2 diabetes mellitus without complications: Secondary | ICD-10-CM

## 2024-06-25 DIAGNOSIS — R5383 Other fatigue: Secondary | ICD-10-CM

## 2024-06-25 DIAGNOSIS — R058 Other specified cough: Secondary | ICD-10-CM

## 2024-06-25 DIAGNOSIS — R7989 Other specified abnormal findings of blood chemistry: Secondary | ICD-10-CM

## 2024-06-25 LAB — POCT GLYCOSYLATED HEMOGLOBIN (HGB A1C): HbA1c, POC (controlled diabetic range): 7.5 % — AB (ref 0.0–7.0)

## 2024-06-25 NOTE — Progress Notes (Signed)
"  °  Date of Visit: 06/25/2024   SUBJECTIVE:   HPI:  Discussed the use of AI scribe software for clinical note transcription with the patient, who gave verbal consent to proceed.  History of Present Illness Annette Cabrera is a 64 year old female with diabetes who presents for follow up.   Respiratory symptoms - Persistent cough since June 12, 2024, initially severe and associated with wheezing - Cough gradually improving but still present - Burning sensation in chest - No recent fever, but chills present - Treated with doxycycline  100 mg twice daily for five days from UC, did not seem to make much of a different in symptoms though again they are improving - Son-in-law tested positive for influenza - Uses albuterol  as needed - Fatigue and weakness ongoing since onset of illness - Exhaustion and weakness have led to falls, including one on New Year's Day. Was weak and fell, chipped tooth, has dentist appointment upcoming - patient reports concern for pneumonia due to persistent symptoms  Diabetes  - Last dose of Ozempic  taken over a week ago - Not eating well, leading to missed doses of Ozempic  - Current diabetes medications include Lantus  30 units daily and metformin  1000 mg daily - Has not been checking sugars    OBJECTIVE:   BP (!) 141/87   Pulse 99   Ht 5' 2 (1.575 m)   Wt 234 lb (106.1 kg)   LMP 09/06/2011 Comment: 5 years ago   SpO2 98%   BMI 42.80 kg/m  Gen: no acute distress, pleasant cooperative HEENT: normocephalic, atraumatic, oropharynx clear, nares patent Heart: regular rate and rhythm, no murmur Lungs: clear to auscultation bilaterally, normal work of breathing on room air, speaks in full sentences without distress Neuro: alert, grossly nonfocal, speech normal Ext: No appreciable lower extremity edema bilaterally   ASSESSMENT/PLAN:   Assessment & Plan Diabetes type 2, controlled (HCC) Controlled with A1c at 7.5. Not eating well, not taking  Ozempic . Risk of hypoglycemia due to insulin  use without adequate food intake. - Check blood sugars regularly, esp while not feeling well - Hold off on Ozempic  until eating improves. Post-viral cough syndrome Persistent cough and fatigue post-influenza. No fever, lungs clear, no pneumonia signs. Symptoms improving. - discussed option of CXR but given overall clinical improvement will defer this for now - given ongoing fatigue, update CBC, CMP Low serum follicle stimulating hormone [facility]) Noted on last labs, obtained to eval menopausal status; patient is s/p endometrial ablation Unclear why FSH would be low at her age, as she is almost certainly in menopause at age 68. After reading more, will plan to add on prolactin to labs to rule out central cause of hypogonadism. May need to see endocrinology.    Stan[place] Si J. Donah, MD [facility] "

## 2024-06-25 NOTE — Patient Instructions (Addendum)
 It was great to see you again today.  Checking labs today - blood counts and kidneys Check sugars at home please - this is important First sugar of the day before you eat anything is the most helpful number, or any time you feel dizzy/lightheaded  Be well, Dr. Donah

## 2024-06-26 ENCOUNTER — Ambulatory Visit: Payer: Self-pay | Admitting: Family Medicine

## 2024-06-26 LAB — CBC
Hematocrit: 45.5 % (ref 34.0–46.6)
Hemoglobin: 14.5 g/dL (ref 11.1–15.9)
MCH: 28.8 pg (ref 26.6–33.0)
MCHC: 31.9 g/dL (ref 31.5–35.7)
MCV: 91 fL (ref 79–97)
Platelets: 391 x10E3/uL (ref 150–450)
RBC: 5.03 x10E6/uL (ref 3.77–5.28)
RDW: 12.6 % (ref 11.7–15.4)
WBC: 7 x10E3/uL (ref 3.4–10.8)

## 2024-06-26 LAB — BASIC METABOLIC PANEL WITH GFR
BUN/Creatinine Ratio: 14 (ref 12–28)
BUN: 8 mg/dL (ref 8–27)
CO2: 25 mmol/L (ref 20–29)
Calcium: 9.8 mg/dL (ref 8.7–10.3)
Chloride: 101 mmol/L (ref 96–106)
Creatinine, Ser: 0.59 mg/dL (ref 0.57–1.00)
Glucose: 166 mg/dL — ABNORMAL HIGH (ref 70–99)
Potassium: 3.9 mmol/L (ref 3.5–5.2)
Sodium: 142 mmol/L (ref 134–144)
eGFR: 102 mL/min/1.73

## 2024-06-27 DIAGNOSIS — R7989 Other specified abnormal findings of blood chemistry: Secondary | ICD-10-CM | POA: Insufficient documentation

## 2024-06-27 NOTE — Assessment & Plan Note (Signed)
 Noted on last labs, obtained to eval menopausal status; patient is s/p endometrial ablation Unclear why FSH would be low at her age, as she is almost certainly in menopause at age 64. After reading more, will plan to add on prolactin to labs to rule out central cause of hypogonadism. May need to see endocrinology.

## 2024-06-27 NOTE — Assessment & Plan Note (Signed)
 Controlled with A1c at 7.5. Not eating well, not taking Ozempic . Risk of hypoglycemia due to insulin  use without adequate food intake. - Check blood sugars regularly, esp while not feeling well - Hold off on Ozempic  until eating improves.

## 2024-06-28 NOTE — Addendum Note (Signed)
 Addended by: DONAH LAYMON PARAS on: 06/28/2024 08:40 AM   Modules accepted: Orders

## 2024-06-29 LAB — PROLACTIN: Prolactin: 4.5 ng/mL (ref 3.6–25.2)

## 2024-06-29 LAB — SPECIMEN STATUS REPORT

## 2024-07-03 ENCOUNTER — Other Ambulatory Visit: Payer: Self-pay | Admitting: Family Medicine

## 2024-07-16 ENCOUNTER — Other Ambulatory Visit: Payer: Self-pay | Admitting: Family Medicine
